# Patient Record
Sex: Male | Born: 1995 | Race: White | Hispanic: No | Marital: Single | Smoking: Former smoker
Health system: Southern US, Community
[De-identification: ages and names within clinical notes are randomized; demographics above are authoritative.]

## PROBLEM LIST (undated history)

## (undated) DIAGNOSIS — F419 Anxiety disorder, unspecified: Secondary | ICD-10-CM

## (undated) HISTORY — PX: OTHER SURGICAL HISTORY: SHX169

## (undated) HISTORY — DX: Anxiety disorder, unspecified: F41.9

---
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
## 2017-12-28 ENCOUNTER — Encounter: Payer: Self-pay | Admitting: Adult Health

## 2017-12-28 ENCOUNTER — Ambulatory Visit: Payer: Managed Care, Other (non HMO) | Admitting: Adult Health

## 2017-12-28 VITALS — BP 110/70 | HR 86 | Resp 16 | Ht 68.0 in | Wt 150.6 lb

## 2017-12-28 DIAGNOSIS — R55 Syncope and collapse: Secondary | ICD-10-CM

## 2017-12-28 DIAGNOSIS — R300 Dysuria: Secondary | ICD-10-CM

## 2017-12-28 DIAGNOSIS — Z0001 Encounter for general adult medical examination with abnormal findings: Secondary | ICD-10-CM | POA: Diagnosis not present

## 2017-12-28 NOTE — Progress Notes (Signed)
[facility] [address] [place], [place] [address]  Internal MEDICINE  Office Visit Note  Patient Name: Dylan Evans  [birth year]  [id number]  Date of Service: 12/28/2017  Chief Complaint  Patient presents with  . Annual Exam    2 weeks ago past out, bp was little elevated in the er.. diagnoses with dehydration no further labs other ekg - normal     HPI Pt is here for routine health maintenance examination.  Mother is present at exam. She reports that on July 8th the pt had a syncopal episode.  He immediately woke up after convulsing for approx 20 seconds.  [facility] and they determined he was dehydrated. He has had no other episodes and feels like his baseline.     Current Medication: Outpatient Encounter Medications as of 12/28/2017  Medication Sig  . loratadine (CLARITIN) 10 MG tablet Take 10 mg by mouth daily.   No facility-administered encounter medications on file as of 12/28/2017.     Surgical History: Past Surgical History:  Procedure Laterality Date  . tubes in ears      Medical History: History reviewed. No pertinent past medical history.  Family History: Family History  Problem Relation Age of Onset  . Stomach cancer Maternal Grandmother   . Interstitial cystitis Maternal Grandfather   . Stomach cancer Paternal Grandmother       Review of Systems  Constitutional: Negative.  Negative for chills, fatigue and unexpected weight change.  HENT: Negative.  Negative for congestion, rhinorrhea, sneezing and sore throat.   Eyes: Negative for redness.  Respiratory: Negative.  Negative for cough, chest tightness and shortness of breath.   Cardiovascular: Negative.  Negative for chest pain and palpitations.  Gastrointestinal: Negative.  Negative for abdominal pain, constipation, diarrhea, nausea and vomiting.  Endocrine: Negative.   Genitourinary: Negative.  Negative for dysuria and frequency.  Musculoskeletal: Negative.  Negative  for arthralgias, back pain, joint swelling and neck pain.  Skin: Negative.  Negative for rash.  Allergic/Immunologic: Negative.   Neurological: Negative.  Negative for tremors and numbness.  Hematological: Negative for adenopathy. Does not bruise/bleed easily.  Psychiatric/Behavioral: Negative.  Negative for behavioral problems, sleep disturbance and suicidal ideas. The patient is not nervous/anxious.      Vital Signs: BP 110/70   Pulse 86   Resp 16   Ht 5\' 8"  (1.727 m)   Wt 150 lb 9.6 oz (68.3 kg)   SpO2 98%   BMI 22.90 kg/m    Physical Exam  Constitutional: He is oriented to person, place, and time. He appears well-developed and well-nourished. No distress.  HENT:  Head: Normocephalic and atraumatic.  Mouth/Throat: Oropharynx is clear and moist. No oropharyngeal exudate.  Eyes: Pupils are equal, round, and reactive to light. EOM are normal.  Neck: Normal range of motion. Neck supple. No JVD present. No tracheal deviation present. No thyromegaly present.  Cardiovascular: Normal rate, regular rhythm and normal heart sounds. Exam reveals no gallop and no friction rub.  No murmur heard. Pulmonary/Chest: Effort normal and breath sounds normal. No respiratory distress. He has no wheezes. He has no rales. He exhibits no tenderness.  Abdominal: Soft. There is no tenderness. There is no guarding. Hernia confirmed negative in the right inguinal area and confirmed negative in the left inguinal area.  Genitourinary: Testes normal and penis normal. Cremasteric reflex is present. Right testis shows no mass, no swelling and no tenderness. Right testis is descended. Left testis shows no  mass, no swelling and no tenderness. Left testis is descended. Circumcised.  Musculoskeletal: Normal range of motion.  Lymphadenopathy:    He has no cervical adenopathy. No inguinal adenopathy noted on the right or left side.  Neurological: He is alert and oriented to person, place, and time. No cranial nerve  deficit.  Skin: Skin is warm and dry. He is not diaphoretic.  Psychiatric: He has a normal mood and affect. His behavior is normal. Judgment and thought content normal.  Nursing note and vitals reviewed.    LABS: No results found for this or any previous visit (from the past 2160 hour(s)).  Assessment/Plan: 1. Encounter for general adult medical examination with abnormal findings - CBC with Differential/Platelet - Lipid Panel With LDL/HDL Ratio - TSH - T4, free - Comprehensive metabolic panel  2. Syncope and collapse Seen in ED. Possible Dehydration. Pt and family to continue to monitor for changes.  3. Dysuria - UA/M w/rflx Culture, Routine  General Counseling: Benjaman verbalizes understanding of the findings of todays visit and agrees with plan of treatment. I have discussed any further diagnostic evaluation that may be needed or ordered today. We also reviewed his medications today. he has been encouraged to call the office with any questions or concerns that should arise related to todays visit.   Orders Placed This Encounter  Procedures  . UA/M w/rflx Culture, Routine    No orders of the defined types were placed in this encounter.   Time spent: 25 Minutes   This patient was seen by Blima Ledger AGNP-C in Collaboration with Dr Lyndon Code as a part of collaborative care agreement   Lyndon Code, MD  Internal Medicine

## 2017-12-28 NOTE — Patient Instructions (Signed)
Syncope Syncope is when you lose temporarily pass out (faint). Signs that you may be about to pass out include:  Feeling dizzy or light-headed.  Feeling sick to your stomach (nauseous).  Seeing all white or all black.  Having cold, clammy skin.  If you passed out, get help right away. Call your local emergency services (911 in the [place] Do not drive yourself to the hospital. Follow these instructions at home: Pay attention to any changes in your symptoms. Take these actions to help with your condition:  Have someone stay with you until you feel stable.  Do not drive, use machinery, or play sports until your doctor says it is okay.  Keep all follow-up visits as told by your doctor. This is important.  If you start to feel like you might pass out, lie down right away and raise (elevate) your feet above the level of your heart. Breathe deeply and steadily. Wait until all of the symptoms are gone.  Drink enough fluid to keep your pee (urine) clear or pale yellow.  If you are taking blood pressure or heart medicine, get up slowly and spend many minutes getting ready to sit and then stand. This can help with dizziness.  Take over-the-counter and prescription medicines only as told by your doctor.  Get help right away if:  You have a very bad headache.  You have unusual pain in your chest, tummy, or back.  You are bleeding from your mouth or rectum.  You have black or tarry poop (stool).  You have a very fast or uneven heartbeat (palpitations).  It hurts to breathe.  You pass out once or more than once.  You have jerky movements that you cannot control (seizure).  You are confused.  You have trouble walking.  You are very weak.  You have vision problems. These symptoms may be an emergency. Do not wait to see if the symptoms will go away. Get medical help right away. Call your local emergency services (911 in the [place] Do not drive yourself to the hospital. This  information is not intended to replace advice given to you by your health care provider. Make sure you discuss any questions you have with your health care provider. Document Released: 11/09/2007 Document Revised: 10/29/2015 Document Reviewed: 02/04/2015 Elsevier Interactive Patient Education  2018 Elsevier Inc.  

## 2017-12-29 LAB — MICROSCOPIC EXAMINATION
Bacteria, UA: NONE SEEN
Casts: NONE SEEN /lpf
Epithelial Cells (non renal): NONE SEEN /hpf (ref 0–10)
WBC, UA: NONE SEEN /hpf (ref 0–5)

## 2017-12-29 LAB — UA/M W/RFLX CULTURE, ROUTINE
Bilirubin, UA: NEGATIVE
Glucose, UA: NEGATIVE
Ketones, UA: NEGATIVE
Leukocytes, UA: NEGATIVE
Nitrite, UA: NEGATIVE
Protein, UA: NEGATIVE
RBC, UA: NEGATIVE
Specific Gravity, UA: 1.01 (ref 1.005–1.030)
Urobilinogen, Ur: 0.2 mg/dL (ref 0.2–1.0)
pH, UA: 5.5 (ref 5.0–7.5)

## 2018-01-08 ENCOUNTER — Encounter: Payer: Self-pay | Admitting: Adult Health

## 2018-01-16 LAB — COMPREHENSIVE METABOLIC PANEL
ALBUMIN: 4.7 g/dL (ref 3.5–5.5)
ALK PHOS: 60 IU/L (ref 39–117)
ALT: 10 IU/L (ref 0–44)
AST: 20 IU/L (ref 0–40)
Albumin/Globulin Ratio: 2.1 (ref 1.2–2.2)
BILIRUBIN TOTAL: 0.5 mg/dL (ref 0.0–1.2)
BUN/Creatinine Ratio: 9 (ref 9–20)
BUN: 11 mg/dL (ref 6–20)
CO2: 24 mmol/L (ref 20–29)
CREATININE: 1.26 mg/dL (ref 0.76–1.27)
Calcium: 9.9 mg/dL (ref 8.7–10.2)
Chloride: 104 mmol/L (ref 96–106)
GFR calc Af Amer: 95 mL/min/{1.73_m2} (ref >59)
GFR calc non Af Amer: 82 mL/min/{1.73_m2} (ref >59)
GLUCOSE: 100 mg/dL — AB (ref 65–99)
Globulin, Total: 2.2 g/dL (ref 1.5–4.5)
POTASSIUM: 4.8 mmol/L (ref 3.5–5.2)
SODIUM: 140 mmol/L (ref 134–144)
Total Protein: 6.9 g/dL (ref 6.0–8.5)

## 2018-01-16 LAB — CBC WITH DIFFERENTIAL/PLATELET
Basophils Absolute: 0 10*3/uL (ref 0.0–0.2)
Basos: 0 %
EOS (ABSOLUTE): 0.1 10*3/uL (ref 0.0–0.4)
EOS: 2 %
HEMATOCRIT: 46.2 % (ref 37.5–51.0)
HEMOGLOBIN: 15.7 g/dL (ref 13.0–17.7)
Immature Grans (Abs): 0 10*3/uL (ref 0.0–0.1)
Immature Granulocytes: 0 %
LYMPHS ABS: 1.3 10*3/uL (ref 0.7–3.1)
Lymphs: 22 %
MCH: 29.8 pg (ref 26.6–33.0)
MCHC: 34 g/dL (ref 31.5–35.7)
MCV: 88 fL (ref 79–97)
MONOS ABS: 0.3 10*3/uL (ref 0.1–0.9)
Monocytes: 5 %
NEUTROS ABS: 4.4 10*3/uL (ref 1.4–7.0)
Neutrophils: 71 %
Platelets: 174 10*3/uL (ref 150–450)
RBC: 5.26 x10E6/uL (ref 4.14–5.80)
RDW: 13 % (ref 12.3–15.4)
WBC: 6.1 10*3/uL (ref 3.4–10.8)

## 2018-01-16 LAB — LIPID PANEL WITH LDL/HDL RATIO
Cholesterol, Total: 150 mg/dL (ref 100–169)
HDL: 46 mg/dL (ref >39)
LDL CALC: 84 mg/dL (ref 0–109)
LDL/HDL RATIO: 1.8 ratio (ref 0.0–3.6)
TRIGLYCERIDES: 101 mg/dL — AB (ref 0–89)
VLDL Cholesterol Cal: 20 mg/dL (ref 5–40)

## 2018-01-16 LAB — TSH: TSH: 1.34 u[IU]/mL (ref 0.450–4.500)

## 2018-01-16 LAB — T4, FREE: Free T4: 1.34 ng/dL (ref 0.93–1.60)

## 2018-03-30 ENCOUNTER — Ambulatory Visit: Payer: Self-pay | Admitting: Adult Health

## 2018-08-20 ENCOUNTER — Ambulatory Visit: Payer: Managed Care, Other (non HMO) | Admitting: Adult Health

## 2018-08-20 ENCOUNTER — Other Ambulatory Visit: Payer: Self-pay

## 2018-08-20 ENCOUNTER — Encounter: Payer: Self-pay | Admitting: Adult Health

## 2018-08-20 VITALS — BP 149/88 | HR 110 | Temp 99.0°F | Resp 16 | Ht 68.0 in | Wt 134.0 lb

## 2018-08-20 DIAGNOSIS — J029 Acute pharyngitis, unspecified: Secondary | ICD-10-CM

## 2018-08-20 DIAGNOSIS — J988 Other specified respiratory disorders: Secondary | ICD-10-CM | POA: Diagnosis not present

## 2018-08-20 DIAGNOSIS — R062 Wheezing: Secondary | ICD-10-CM | POA: Diagnosis not present

## 2018-08-20 DIAGNOSIS — R634 Abnormal weight loss: Secondary | ICD-10-CM | POA: Diagnosis not present

## 2018-08-20 LAB — POCT INFLUENZA A/B
INFLUENZA A, POC: NEGATIVE
INFLUENZA B, POC: NEGATIVE

## 2018-08-20 MED ORDER — AZITHROMYCIN 250 MG PO TABS: ORAL_TABLET | ORAL | 0 refills | Status: DC

## 2018-08-20 MED ORDER — PREDNISONE 10 MG PO TABS: ORAL_TABLET | ORAL | 0 refills | Status: DC

## 2018-08-20 MED ORDER — ALBUTEROL SULFATE HFA 108 (90 BASE) MCG/ACT IN AERS: 2.0000 | INHALATION_SPRAY | Freq: Four times a day (QID) | RESPIRATORY_TRACT | 0 refills | Status: DC | PRN

## 2018-08-20 NOTE — Progress Notes (Signed)
[facility] [address] [place], [place] [address]  Internal MEDICINE  Office Visit Note  Patient Name: Dylan Evans  [id number]  [id number]  Date of Service: 08/20/2018  Chief Complaint  Patient presents with  . Cough    staying cold , was in [place] last week for spring break, symptoms started 2-3 days ago , cough and congestion started   . Fever  . Sore Throat     HPI Pt is here for a sick visit. Pt reports 2-3 days ago he noticed some congestion.  He quickly prgressed to coughing. He denies productivity with his cough.  He has felt chills, but his tmax has been 99.6.  He reports his throat is still sore. He just returned from a spring break trip to [place].  He states he consumed a large amount of alcohol while he was gone.  His mother is in the room initially. He has also lost 16 pounds since his visit last July.  He dates that school has intensified and he found himself sitting and not as active.  His diet has changed as he is not eating breakfast.  He report feels nauseous if he eats first thing in the morning.  He is typically eating lunch and dinner however is not eating the normal amount that he was.        Current Medication:  Outpatient Encounter Medications as of 08/20/2018  Medication Sig  . albuterol (PROVENTIL HFA;VENTOLIN HFA) 108 (90 Base) MCG/ACT inhaler Inhale 2 puffs into the lungs every 6 (six) hours as needed for wheezing or shortness of breath.  Marland Kitchen azithromycin (ZITHROMAX) 250 MG tablet Take as directed  . loratadine (CLARITIN) 10 MG tablet Take 10 mg by mouth daily.  . predniSONE (DELTASONE) 10 MG tablet Use per dose pack   No facility-administered encounter medications on file as of 08/20/2018.       Medical History: History reviewed. No pertinent past medical history.   Vital Signs: BP (!) 149/88   Pulse (!) 110   Temp 99 F (37.2 C) (Oral)   Resp 16   Ht 5\' 8"  (1.727 m)   Wt 134 lb (60.8 kg)   SpO2 94%   BMI 20.37 kg/m     Review of Systems  Constitutional: Positive for fever. Negative for chills, fatigue and unexpected weight change.  HENT: Positive for sore throat. Negative for congestion, rhinorrhea and sneezing.   Eyes: Negative for redness.  Respiratory: Positive for cough and wheezing. Negative for chest tightness and shortness of breath.   Cardiovascular: Negative.  Negative for chest pain and palpitations.  Gastrointestinal: Negative.  Negative for abdominal pain, constipation, diarrhea, nausea and vomiting.  Endocrine: Negative.   Genitourinary: Negative.  Negative for dysuria and frequency.  Musculoskeletal: Negative.  Negative for arthralgias, back pain, joint swelling and neck pain.  Skin: Negative.  Negative for rash.  Allergic/Immunologic: Negative.   Neurological: Negative.  Negative for tremors and numbness.  Hematological: Negative for adenopathy. Does not bruise/bleed easily.  Psychiatric/Behavioral: Negative.  Negative for behavioral problems, sleep disturbance and suicidal ideas. The patient is not nervous/anxious.     Physical Exam Vitals signs and nursing note reviewed.  Constitutional:      General: He is not in acute distress.    Appearance: He is well-developed. He is not diaphoretic.  HENT:     Head: Normocephalic and atraumatic.     Mouth/Throat:     Pharynx: No oropharyngeal exudate.  Eyes:     Pupils: Pupils  are equal, round, and reactive to light.  Neck:     Musculoskeletal: Normal range of motion and neck supple.     Thyroid: No thyromegaly.     Vascular: No JVD.     Trachea: No tracheal deviation.  Cardiovascular:     Rate and Rhythm: Normal rate and regular rhythm.     Heart sounds: Normal heart sounds. No murmur. No friction rub. No gallop.   Pulmonary:     Effort: Pulmonary effort is normal. No respiratory distress.     Breath sounds: Wheezing present. No rales.  Chest:     Chest wall: No tenderness.  Abdominal:     Palpations: Abdomen is soft.      Tenderness: There is no abdominal tenderness. There is no guarding.  Musculoskeletal: Normal range of motion.  Lymphadenopathy:     Cervical: No cervical adenopathy.  Skin:    General: Skin is warm and dry.  Neurological:     Mental Status: He is alert and oriented to person, place, and time.     Cranial Nerves: No cranial nerve deficit.  Psychiatric:        Behavior: Behavior normal.        Thought Content: Thought content normal.        Judgment: Judgment normal.     Assessment/Plan: 1. Respiratory infection Patient provided with course of azithromycin.  Instructed him on how to use medications to completion.  He verbalized understanding.  Reports he will follow-up here in clinic if symptoms fail to improve. - azithromycin (ZITHROMAX) 250 MG tablet; Take as directed  Dispense: 6 tablet; Refill: 0  2. Wheezing Right patient with presumed taper as well as albuterol inhaler we discussed using albuterol inhaler every 6 hours for the first 2 days and then he can use as needed after that.  Instructed to return to clinic if symptoms worsen or fail to improve with medication. - predniSONE (DELTASONE) 10 MG tablet; Use per dose pack  Dispense: 21 tablet; Refill: 0 - albuterol (PROVENTIL HFA;VENTOLIN HFA) 108 (90 Base) MCG/ACT inhaler; Inhale 2 puffs into the lungs every 6 (six) hours as needed for wheezing or shortness of breath.  Dispense: 1 Inhaler; Refill: 0  3. Sore throat Flu negative.  - POCT Influenza A/B  4. Abnormal weight loss Pt has lost 16 pounds since July of last year.  He denies any extreme changes.   Lab orders given to patient, will review as available.   General Counseling: Dylan Evans verbalizes understanding of the findings of todays visit and agrees with plan of treatment. I have discussed any further diagnostic evaluation that may be needed or ordered today. We also reviewed his medications today. he has been encouraged to call the office with any questions or concerns  that should arise related to todays visit.   Orders Placed This Encounter  Procedures  . POCT Influenza A/B    Meds ordered this encounter  Medications  . azithromycin (ZITHROMAX) 250 MG tablet    Sig: Take as directed    Dispense:  6 tablet    Refill:  0  . predniSONE (DELTASONE) 10 MG tablet    Sig: Use per dose pack    Dispense:  21 tablet    Refill:  0  . albuterol (PROVENTIL HFA;VENTOLIN HFA) 108 (90 Base) MCG/ACT inhaler    Sig: Inhale 2 puffs into the lungs every 6 (six) hours as needed for wheezing or shortness of breath.    Dispense:  1 Inhaler  Refill:  0    Time spent: 25 Minutes  This patient was seen by Orson Gear AGNP-C in Collaboration with Dr Lavera Guise as a part of collaborative care agreement.  Kendell Bane AGNP-C Internal Medicine

## 2018-08-21 ENCOUNTER — Other Ambulatory Visit: Payer: Self-pay | Admitting: Adult Health

## 2018-08-22 LAB — CBC WITH DIFFERENTIAL/PLATELET
Basophils Absolute: 0 10*3/uL (ref 0.0–0.2)
Basos: 0 %
EOS (ABSOLUTE): 0.3 10*3/uL (ref 0.0–0.4)
Eos: 3 %
Hematocrit: 39.4 % (ref 37.5–51.0)
Hemoglobin: 13.9 g/dL (ref 13.0–17.7)
Immature Grans (Abs): 0 10*3/uL (ref 0.0–0.1)
Immature Granulocytes: 0 %
Lymphocytes Absolute: 1.5 10*3/uL (ref 0.7–3.1)
Lymphs: 15 %
MCH: 30.5 pg (ref 26.6–33.0)
MCHC: 35.3 g/dL (ref 31.5–35.7)
MCV: 87 fL (ref 79–97)
Monocytes Absolute: 0.8 10*3/uL (ref 0.1–0.9)
Monocytes: 8 %
Neutrophils Absolute: 7.6 10*3/uL — ABNORMAL HIGH (ref 1.4–7.0)
Neutrophils: 74 %
Platelets: 229 10*3/uL (ref 150–450)
RBC: 4.55 x10E6/uL (ref 4.14–5.80)
RDW: 12 % (ref 11.6–15.4)
WBC: 10.3 10*3/uL (ref 3.4–10.8)

## 2018-08-22 LAB — COMPREHENSIVE METABOLIC PANEL
ALT: 9 IU/L (ref 0–44)
AST: 18 IU/L (ref 0–40)
Albumin/Globulin Ratio: 1.6 (ref 1.2–2.2)
Albumin: 4.3 g/dL (ref 4.1–5.2)
Alkaline Phosphatase: 71 IU/L (ref 39–117)
BUN/Creatinine Ratio: 6 — ABNORMAL LOW (ref 9–20)
BUN: 7 mg/dL (ref 6–20)
Bilirubin Total: 0.5 mg/dL (ref 0.0–1.2)
CALCIUM: 9.8 mg/dL (ref 8.7–10.2)
CO2: 25 mmol/L (ref 20–29)
Chloride: 98 mmol/L (ref 96–106)
Creatinine, Ser: 1.2 mg/dL (ref 0.76–1.27)
GFR calc Af Amer: 100 mL/min/{1.73_m2} (ref >59)
GFR calc non Af Amer: 87 mL/min/{1.73_m2} (ref >59)
Globulin, Total: 2.7 g/dL (ref 1.5–4.5)
Glucose: 92 mg/dL (ref 65–99)
Potassium: 4.4 mmol/L (ref 3.5–5.2)
Sodium: 139 mmol/L (ref 134–144)
Total Protein: 7 g/dL (ref 6.0–8.5)

## 2018-08-22 LAB — LIPID PANEL WITH LDL/HDL RATIO
Cholesterol, Total: 126 mg/dL (ref 100–199)
HDL: 43 mg/dL (ref >39)
LDL CALC: 64 mg/dL (ref 0–99)
LDl/HDL Ratio: 1.5 ratio (ref 0.0–3.6)
Triglycerides: 94 mg/dL (ref 0–149)
VLDL Cholesterol Cal: 19 mg/dL (ref 5–40)

## 2018-08-22 LAB — IRON AND TIBC
Iron Saturation: 21 % (ref 15–55)
Iron: 45 ug/dL (ref 38–169)
Total Iron Binding Capacity: 218 ug/dL — ABNORMAL LOW (ref 250–450)
UIBC: 173 ug/dL (ref 111–343)

## 2018-08-22 LAB — T3 UPTAKE: T3 Uptake Ratio: 30 % (ref 24–39)

## 2018-08-22 LAB — B12 AND FOLATE PANEL
Folate: 8.1 ng/mL (ref >3.0)
Vitamin B-12: 528 pg/mL (ref 232–1245)

## 2018-08-22 LAB — T4, FREE: FREE T4: 1.36 ng/dL (ref 0.82–1.77)

## 2018-08-22 LAB — VITAMIN D 25 HYDROXY (VIT D DEFICIENCY, FRACTURES): VIT D 25 HYDROXY: 38.6 ng/mL (ref 30.0–100.0)

## 2018-08-22 LAB — FERRITIN: Ferritin: 343 ng/mL (ref 30–400)

## 2018-11-12 ENCOUNTER — Ambulatory Visit: Payer: Managed Care, Other (non HMO) | Admitting: Adult Health

## 2018-11-12 ENCOUNTER — Encounter: Payer: Self-pay | Admitting: Adult Health

## 2018-11-12 ENCOUNTER — Other Ambulatory Visit: Payer: Self-pay

## 2018-11-12 VITALS — Resp 16 | Ht 68.0 in | Wt 145.0 lb

## 2018-11-12 DIAGNOSIS — R634 Abnormal weight loss: Secondary | ICD-10-CM

## 2018-11-12 DIAGNOSIS — F419 Anxiety disorder, unspecified: Secondary | ICD-10-CM

## 2018-11-12 MED ORDER — CITALOPRAM HYDROBROMIDE 20 MG PO TABS: 20.0000 mg | ORAL_TABLET | Freq: Every day | ORAL | 1 refills | Status: DC

## 2018-11-12 NOTE — Progress Notes (Signed)
[facility] [address] [place], [place] [address]  Internal MEDICINE  Telephone Visit  Patient Name: Dylan Evans  1997  [id number]  Date of Service: 11/12/2018  I connected with the patient at [phone] by telephone and verified the patients identity using two identifiers.   I discussed the limitations, risks, security and privacy concerns of performing an evaluation and management service by telephone and the availability of in person appointments. I also discussed with the patient that there may be a patient responsible charge related to the service.  The patient expressed understanding and agrees to proceed.    Chief Complaint  Patient presents with  . Telephone Screen  . Anxiety    having some anxiety at nightime that is disrupting sleep   . Insomnia  . Telephone Assessment    HPI  Pt is seen via telephone complaining today of anxiety.  He especially complains of nighttime anxiety and racing thoughts when he lays down to sleep at night.  He has had some episodes during the day, however its mostly at night. He denies being able to pinpoint anything that is causing him stress.  He feels happy and content until an episode happens.    Current Medication: Outpatient Encounter Medications as of 11/12/2018  Medication Sig  . loratadine (CLARITIN) 10 MG tablet Take 10 mg by mouth daily.  . [DISCONTINUED] albuterol (PROVENTIL HFA;VENTOLIN HFA) 108 (90 Base) MCG/ACT inhaler Inhale 2 puffs into the lungs every 6 (six) hours as needed for wheezing or shortness of breath. (Patient not taking: Reported on 11/12/2018)  . [DISCONTINUED] azithromycin (ZITHROMAX) 250 MG tablet Take as directed (Patient not taking: Reported on 11/12/2018)  . [DISCONTINUED] predniSONE (DELTASONE) 10 MG tablet Use per dose pack (Patient not taking: Reported on 11/12/2018)   No facility-administered encounter medications on file as of 11/12/2018.     Surgical History: Past Surgical History:  Procedure  Laterality Date  . tubes in ears      Medical History: History reviewed. No pertinent past medical history.  Family History: Family History  Problem Relation Age of Onset  . Stomach cancer Maternal Grandmother   . Interstitial cystitis Maternal Grandfather   . Stomach cancer Paternal Grandmother     Social History   Socioeconomic History  . Marital status: Single    Spouse name: Not on file  . Number of children: Not on file  . Years of education: Not on file  . Highest education level: Not on file  Occupational History  . Not on file  Social Needs  . Financial resource strain: Not on file  . Food insecurity:    Worry: Not on file    Inability: Not on file  . Transportation needs:    Medical: Not on file    Non-medical: Not on file  Tobacco Use  . Smoking status: Never Smoker  . Smokeless tobacco: Never Used  Substance and Sexual Activity  . Alcohol use: Never    Frequency: Never  . Drug use: Never  . Sexual activity: Not on file  Lifestyle  . Physical activity:    Days per week: Not on file    Minutes per session: Not on file  . Stress: Not on file  Relationships  . Social connections:    Talks on phone: Not on file    Gets together: Not on file    Attends religious service: Not on file    Active member of club or organization: Not on file  Attends meetings of clubs or organizations: Not on file    Relationship status: Not on file  . Intimate partner violence:    Fear of current or ex partner: Not on file    Emotionally abused: Not on file    Physically abused: Not on file    Forced sexual activity: Not on file  Other Topics Concern  . Not on file  Social History Narrative  . Not on file      Review of Systems  Constitutional: Negative.  Negative for chills, fatigue and unexpected weight change.  HENT: Negative.  Negative for congestion, rhinorrhea, sneezing and sore throat.   Eyes: Negative for redness.  Respiratory: Negative.  Negative for  cough, chest tightness and shortness of breath.   Cardiovascular: Negative.  Negative for chest pain and palpitations.  Gastrointestinal: Negative.  Negative for abdominal pain, constipation, diarrhea, nausea and vomiting.  Endocrine: Negative.   Genitourinary: Negative.  Negative for dysuria and frequency.  Musculoskeletal: Negative.  Negative for arthralgias, back pain, joint swelling and neck pain.  Skin: Negative.  Negative for rash.  Allergic/Immunologic: Negative.   Neurological: Negative.  Negative for tremors and numbness.  Hematological: Negative for adenopathy. Does not bruise/bleed easily.  Psychiatric/Behavioral: Negative.  Negative for behavioral problems, sleep disturbance and suicidal ideas. The patient is not nervous/anxious.     Vital Signs: Resp 16   Ht 5\' 8"  (1.727 m)   Wt 145 lb (65.8 kg)   BMI 22.05 kg/m    Observation/Objective:  Well appearing, nad noted at this time.   Assessment/Plan: 1. Anxiety Start taking Celexa as prescribed.  Discussed common side effects.  Will follow up in 7 weeks.  Pt is instructed to call office if new or worsening symptoms or reactions occur.  - citalopram (CELEXA) 20 MG tablet; Take 1 tablet (20 mg total) by mouth daily.  Dispense: 30 tablet; Refill: 1  2. Abnormal weight loss PT has managed to gain back 10 pounds since being home from college during pandemic. Will continue to monitor.  General Counseling: Dylan Evans verbalizes understanding of the findings of today's phone visit and agrees with plan of treatment. I have discussed any further diagnostic evaluation that may be needed or ordered today. We also reviewed his medications today. he has been encouraged to call the office with any questions or concerns that should arise related to todays visit.    No orders of the defined types were placed in this encounter.   No orders of the defined types were placed in this encounter.   Time spent: [place]  [facility] Internal medicine

## 2018-11-12 NOTE — Patient Instructions (Addendum)

## 2018-12-31 ENCOUNTER — Encounter: Payer: Self-pay | Admitting: Adult Health

## 2019-05-15 ENCOUNTER — Other Ambulatory Visit: Payer: Self-pay

## 2019-05-15 ENCOUNTER — Ambulatory Visit (INDEPENDENT_AMBULATORY_CARE_PROVIDER_SITE_OTHER): Payer: Managed Care, Other (non HMO) | Admitting: Adult Health

## 2019-05-15 ENCOUNTER — Encounter: Payer: Self-pay | Admitting: Adult Health

## 2019-05-15 VITALS — BP 113/69 | HR 89 | Temp 98.1°F | Resp 16 | Ht 68.0 in | Wt 150.4 lb

## 2019-05-15 DIAGNOSIS — G4700 Insomnia, unspecified: Secondary | ICD-10-CM

## 2019-05-15 DIAGNOSIS — F419 Anxiety disorder, unspecified: Secondary | ICD-10-CM

## 2019-05-15 DIAGNOSIS — R55 Syncope and collapse: Secondary | ICD-10-CM

## 2019-05-15 DIAGNOSIS — R5383 Other fatigue: Secondary | ICD-10-CM

## 2019-05-15 DIAGNOSIS — R300 Dysuria: Secondary | ICD-10-CM

## 2019-05-15 DIAGNOSIS — Z0001 Encounter for general adult medical examination with abnormal findings: Secondary | ICD-10-CM | POA: Diagnosis not present

## 2019-05-15 DIAGNOSIS — R634 Abnormal weight loss: Secondary | ICD-10-CM

## 2019-05-15 MED ORDER — HYDROXYZINE HCL 25 MG PO TABS: 25.0000 mg | ORAL_TABLET | Freq: Every evening | ORAL | 1 refills | Status: DC | PRN

## 2019-05-15 NOTE — Progress Notes (Signed)
[facility] [address] [place], [place] [address]  Internal MEDICINE  Office Visit Note  Patient Name: Dylan Evans  [id number]  [id number]  Date of Service: 05/15/2019  Chief Complaint  Patient presents with  . Annual Exam  . Anxiety  . Insomnia     HPI Pt is here for routine health maintenance examination.  Pt is a well appearing 23 yo male. He continues to report issues with insomnia, that he relates to his anxiety. He was placed on Celexa at last visit, and reports he took this medication for 2 months before his mom didn't feel comfortable with him taking it anymore.  Since then he feels like his anxiety is better controlled and he reports online school due to covid, has become easier. He does continue to report insomnia, and describes this as his most urgent complaint. He reports trying multiple OTC medications, and gets mild relief from them at times.  He reports having difficulty falling asleep, as well as staying asleep.  When he takes OTC meds he may sleep for 2 hours but then he will be awake again.  He appears well today, he is smiling and laughing in the exam room.     Current Medication: Outpatient Encounter Medications as of 05/15/2019  Medication Sig  . citalopram (CELEXA) 20 MG tablet Take 1 tablet (20 mg total) by mouth daily.  Marland Kitchen loratadine (CLARITIN) 10 MG tablet Take 10 mg by mouth daily.   No facility-administered encounter medications on file as of 05/15/2019.     Surgical History: Past Surgical History:  Procedure Laterality Date  . tubes in ears      Medical History: History reviewed. No pertinent past medical history.  Family History: Family History  Problem Relation Age of Onset  . Stomach cancer Maternal Grandmother   . Interstitial cystitis Maternal Grandfather   . Stomach cancer Paternal Grandmother       Review of Systems  Constitutional: Negative.  Negative for chills, fatigue and unexpected weight change.  HENT:  Negative.  Negative for congestion, rhinorrhea, sneezing and sore throat.   Eyes: Negative for redness.  Respiratory: Negative.  Negative for cough, chest tightness and shortness of breath.   Cardiovascular: Negative.  Negative for chest pain and palpitations.  Gastrointestinal: Negative.  Negative for abdominal pain, constipation, diarrhea, nausea and vomiting.  Endocrine: Negative.   Genitourinary: Negative.  Negative for dysuria and frequency.  Musculoskeletal: Negative.  Negative for arthralgias, back pain, joint swelling and neck pain.  Skin: Negative.  Negative for rash.  Allergic/Immunologic: Negative.   Neurological: Negative.  Negative for tremors and numbness.  Hematological: Negative for adenopathy. Does not bruise/bleed easily.  Psychiatric/Behavioral: Negative.  Negative for behavioral problems, sleep disturbance and suicidal ideas. The patient is not nervous/anxious.      Vital Signs: BP 113/69   Pulse 89   Temp 98.1 F (36.7 C)   Resp 16   Ht 5\' 8"  (1.727 m)   Wt 150 lb 6.4 oz (68.2 kg)   SpO2 99%   BMI 22.87 kg/m    Physical Exam Vitals and nursing note reviewed.  Constitutional:      General: He is not in acute distress.    Appearance: He is well-developed. He is not diaphoretic.  HENT:     Head: Normocephalic and atraumatic.     Mouth/Throat:     Pharynx: No oropharyngeal exudate.  Eyes:     Pupils: Pupils are equal, round, and reactive to light.  Neck:  Thyroid: No thyromegaly.     Vascular: No JVD.     Trachea: No tracheal deviation.  Cardiovascular:     Rate and Rhythm: Normal rate and regular rhythm.     Heart sounds: Normal heart sounds. No murmur. No friction rub. No gallop.   Pulmonary:     Effort: Pulmonary effort is normal. No respiratory distress.     Breath sounds: Normal breath sounds. No wheezing or rales.  Chest:     Chest wall: No tenderness.  Abdominal:     Palpations: Abdomen is soft.     Tenderness: There is no abdominal  tenderness. There is no guarding.     Hernia: There is no hernia in the left inguinal area or right inguinal area.  Genitourinary:    Penis: Normal and circumcised.      Testes: Normal. Cremasteric reflex is present.        Right: Mass, tenderness or swelling not present.        Left: Mass, tenderness or swelling not present.     Epididymis:     Right: Normal.     Left: Normal.  Musculoskeletal:        General: Normal range of motion.     Cervical back: Normal range of motion and neck supple.  Lymphadenopathy:     Cervical: No cervical adenopathy.     Lower Body: No right inguinal adenopathy. No left inguinal adenopathy.  Skin:    General: Skin is warm and dry.  Neurological:     Mental Status: He is alert and oriented to person, place, and time.     Cranial Nerves: No cranial nerve deficit.  Psychiatric:        Behavior: Behavior normal.        Thought Content: Thought content normal.        Judgment: Judgment normal.    LABS: No results found for this or any previous visit (from the past 2160 hour(s)).   Assessment/Plan: 1. Encounter for general adult medical examination with abnormal findings Up to date on PHM, will get labs and review with patient at next visit.  - CBC with Differential/Platelet - Lipid Panel With LDL/HDL Ratio - TSH - T4, free - Comprehensive metabolic panel  2. Anxiety Pt reports coping with anxiety, he does feel it is contributing to his insomnia. We discussed Atarax could help with anxiety and insomnia.  3. Insomnia, unspecified type Will start Atarax and follow up with patient.  Discussed side effects of Atarax and to not drive until he knows what those side effects will be. Also do not drink alcohol.  - hydrOXYzine (ATARAX/VISTARIL) 25 MG tablet; Take 1 tablet (25 mg total) by mouth at bedtime as needed.  Dispense: 30 tablet; Refill: 1  4. Other fatigue Recheck following levels - B12 and Folate Panel - Vitamin D 1,25 dihydroxy -  Fe+TIBC+Fer  5. Dysuria - UA/M w/rflx Culture, Routine - Microscopic Examination  6. Abnormal weight loss PTs weight has stabilized and he feels good at this weight.  7. Syncope and collapse No other issues, he has not had another event.   General Counseling: Dylan Evans verbalizes understanding of the findings of todays visit and agrees with plan of treatment. I have discussed any further diagnostic evaluation that may be needed or ordered today. We also reviewed his medications today. he has been encouraged to call the office with any questions or concerns that should arise related to todays visit.   Orders Placed This Encounter  Procedures  .  UA/M w/rflx Culture, Routine    No orders of the defined types were placed in this encounter.   Time spent: 30 Minutes   This patient was seen by Orson Gear AGNP-C in Collaboration with Dr Lavera Guise as a part of collaborative care agreement    Kendell Bane AGNP-C Internal Medicine

## 2019-05-16 LAB — UA/M W/RFLX CULTURE, ROUTINE
Bilirubin, UA: NEGATIVE
Glucose, UA: NEGATIVE
Ketones, UA: NEGATIVE
Leukocytes,UA: NEGATIVE
Nitrite, UA: NEGATIVE
Protein,UA: NEGATIVE
RBC, UA: NEGATIVE
Specific Gravity, UA: 1.027 (ref 1.005–1.030)
Urobilinogen, Ur: 1 mg/dL (ref 0.2–1.0)
pH, UA: 7 (ref 5.0–7.5)

## 2019-05-16 LAB — MICROSCOPIC EXAMINATION
Bacteria, UA: NONE SEEN
Casts: NONE SEEN /lpf
Epithelial Cells (non renal): NONE SEEN /hpf (ref 0–10)

## 2019-06-05 ENCOUNTER — Encounter: Payer: Self-pay | Admitting: Adult Health

## 2019-06-05 ENCOUNTER — Ambulatory Visit: Payer: Managed Care, Other (non HMO) | Admitting: Adult Health

## 2019-06-05 ENCOUNTER — Other Ambulatory Visit: Payer: Self-pay

## 2019-06-05 VITALS — BP 117/71 | HR 89 | Ht 68.0 in | Wt 152.8 lb

## 2019-06-05 DIAGNOSIS — G4700 Insomnia, unspecified: Secondary | ICD-10-CM | POA: Diagnosis not present

## 2019-06-05 DIAGNOSIS — F419 Anxiety disorder, unspecified: Secondary | ICD-10-CM

## 2019-06-05 MED ORDER — HYDROXYZINE HCL 25 MG PO TABS: 25.0000 mg | ORAL_TABLET | Freq: Every evening | ORAL | 2 refills | Status: DC | PRN

## 2019-06-05 NOTE — Progress Notes (Signed)
[facility] [address] [place], [place] [address]  Internal MEDICINE  Office Visit Note  Patient Name: Dylan Evans  [id number]  [id number]  Date of Service: 06/05/2019  Chief Complaint  Patient presents with  . Follow-up    sleep medication review, pt states medication does not really help him sleep. pt did not do labs    HPI  Pt is here for follow up.  At last visit he started on 25mg  hydroxyzine for sleep and anxiety.  He reports he is not waking up multiple times a night like he was before.  He still has difficulty falling asleep.  He is focusing on his sleep hygiene and has been sticking to his routine.    Current Medication: Outpatient Encounter Medications as of 06/05/2019  Medication Sig  . citalopram (CELEXA) 20 MG tablet Take 1 tablet (20 mg total) by mouth daily.  . hydrOXYzine (ATARAX/VISTARIL) 25 MG tablet Take 1-2 tablets (25-50 mg total) by mouth at bedtime as needed.  . loratadine (CLARITIN) 10 MG tablet Take 10 mg by mouth daily.  . [DISCONTINUED] hydrOXYzine (ATARAX/VISTARIL) 25 MG tablet Take 1 tablet (25 mg total) by mouth at bedtime as needed.   No facility-administered encounter medications on file as of 06/05/2019.    Surgical History: Past Surgical History:  Procedure Laterality Date  . tubes in ears      Medical History: History reviewed. No pertinent past medical history.  Family History: Family History  Problem Relation Age of Onset  . Stomach cancer Maternal Grandmother   . Interstitial cystitis Maternal Grandfather   . Stomach cancer Paternal Grandmother     Social History   Socioeconomic History  . Marital status: Single    Spouse name: Not on file  . Number of children: Not on file  . Years of education: Not on file  . Highest education level: Not on file  Occupational History  . Not on file  Tobacco Use  . Smoking status: Never Smoker  . Smokeless tobacco: Never Used  Substance and Sexual Activity  .  Alcohol use: Yes    Comment: 2-3 days month  . Drug use: Never  . Sexual activity: Not on file  Other Topics Concern  . Not on file  Social History Narrative  . Not on file   Social Determinants of Health   Financial Resource Strain:   . Difficulty of Paying Living Expenses: Not on file  Food Insecurity:   . Worried About 06/07/2019 in the Last Year: Not on file  . Ran Out of Food in the Last Year: Not on file  Transportation Needs:   . Lack of Transportation (Medical): Not on file  . Lack of Transportation (Non-Medical): Not on file  Physical Activity:   . Days of Exercise per Week: Not on file  . Minutes of Exercise per Session: Not on file  Stress:   . Feeling of Stress : Not on file  Social Connections:   . Frequency of Communication with Friends and Family: Not on file  . Frequency of Social Gatherings with Friends and Family: Not on file  . Attends Religious Services: Not on file  . Active Member of Clubs or Organizations: Not on file  . Attends Programme researcher, broadcasting/film/video Meetings: Not on file  . Marital Status: Not on file  Intimate Partner Violence:   . Fear of Current or Ex-Partner: Not on file  . Emotionally Abused: Not on file  . Physically Abused: Not  on file  . Sexually Abused: Not on file      Review of Systems  Constitutional: Negative.  Negative for chills, fatigue and unexpected weight change.  HENT: Negative.  Negative for congestion, rhinorrhea, sneezing and sore throat.   Eyes: Negative for redness.  Respiratory: Negative.  Negative for cough, chest tightness and shortness of breath.   Cardiovascular: Negative.  Negative for chest pain and palpitations.  Gastrointestinal: Negative.  Negative for abdominal pain, constipation, diarrhea, nausea and vomiting.  Endocrine: Negative.   Genitourinary: Negative.  Negative for dysuria and frequency.  Musculoskeletal: Negative.  Negative for arthralgias, back pain, joint swelling and neck pain.  Skin:  Negative.  Negative for rash.  Allergic/Immunologic: Negative.   Neurological: Negative.  Negative for tremors and numbness.  Hematological: Negative for adenopathy. Does not bruise/bleed easily.  Psychiatric/Behavioral: Negative.  Negative for behavioral problems, sleep disturbance and suicidal ideas. The patient is not nervous/anxious.     Vital Signs: BP 117/71   Pulse 89   Ht 5\' 8"  (1.727 m)   Wt 152 lb 12.8 oz (69.3 kg)   SpO2 98%   BMI 23.23 kg/m    Physical Exam Vitals and nursing note reviewed.  Constitutional:      General: He is not in acute distress.    Appearance: He is well-developed. He is not diaphoretic.  HENT:     Head: Normocephalic and atraumatic.     Mouth/Throat:     Pharynx: No oropharyngeal exudate.  Eyes:     Pupils: Pupils are equal, round, and reactive to light.  Neck:     Thyroid: No thyromegaly.     Vascular: No JVD.     Trachea: No tracheal deviation.  Cardiovascular:     Rate and Rhythm: Normal rate and regular rhythm.     Heart sounds: Normal heart sounds. No murmur. No friction rub. No gallop.   Pulmonary:     Effort: Pulmonary effort is normal. No respiratory distress.     Breath sounds: Normal breath sounds. No wheezing or rales.  Chest:     Chest wall: No tenderness.  Abdominal:     Palpations: Abdomen is soft.     Tenderness: There is no abdominal tenderness. There is no guarding.  Musculoskeletal:        General: Normal range of motion.     Cervical back: Normal range of motion and neck supple.  Lymphadenopathy:     Cervical: No cervical adenopathy.  Skin:    General: Skin is warm and dry.  Neurological:     Mental Status: He is alert and oriented to person, place, and time.     Cranial Nerves: No cranial nerve deficit.  Psychiatric:        Behavior: Behavior normal.        Thought Content: Thought content normal.        Judgment: Judgment normal.     Assessment/Plan: 1. Insomnia, unspecified type Continue hydroxyzine,  increase to 2 tablets at night before bed.   2. Anxiety Encouraged him to start re-start Celexa.  Discussed benefits with anxiety, he agrees to try for 8 weeks.    General Counseling: Lovell verbalizes understanding of the findings of todays visit and agrees with plan of treatment. I have discussed any further diagnostic evaluation that may be needed or ordered today. We also reviewed his medications today. he has been encouraged to call the office with any questions or concerns that should arise related to todays visit.  No orders of the defined types were placed in this encounter.   Meds ordered this encounter  Medications  . hydrOXYzine (ATARAX/VISTARIL) 25 MG tablet    Sig: Take 1-2 tablets (25-50 mg total) by mouth at bedtime as needed.    Dispense:  60 tablet    Refill:  2    Time spent: 15 Minutes   This patient was seen by Blima LedgerAdam Tymarion Everard AGNP-C in Collaboration with Dr Lyndon CodeFozia M Khan as a part of collaborative care agreement     Johnna AcostaAdam J. Mohid Furuya AGNP-C Internal medicine

## 2019-08-09 ENCOUNTER — Telehealth: Payer: Self-pay

## 2019-08-09 NOTE — Telephone Encounter (Signed)
Tried contacting patient to inform of appointment on 08/13/2019, no vm. klh

## 2019-08-13 ENCOUNTER — Ambulatory Visit: Payer: Managed Care, Other (non HMO) | Admitting: Adult Health

## 2019-08-14 ENCOUNTER — Ambulatory Visit: Payer: Managed Care, Other (non HMO) | Admitting: Adult Health

## 2019-08-15 ENCOUNTER — Telehealth: Payer: Self-pay

## 2019-08-15 NOTE — Telephone Encounter (Signed)
CONFIRMED AND SCREENED FOR APPT 08/19/19

## 2019-08-19 ENCOUNTER — Ambulatory Visit: Payer: Managed Care, Other (non HMO) | Admitting: Adult Health

## 2019-08-19 ENCOUNTER — Encounter: Payer: Self-pay | Admitting: Adult Health

## 2019-08-19 ENCOUNTER — Other Ambulatory Visit: Payer: Self-pay

## 2019-08-19 VITALS — BP 125/86 | HR 69 | Temp 97.6°F | Resp 16 | Ht 69.0 in | Wt 149.0 lb

## 2019-08-19 DIAGNOSIS — R5383 Other fatigue: Secondary | ICD-10-CM | POA: Diagnosis not present

## 2019-08-19 DIAGNOSIS — G4700 Insomnia, unspecified: Secondary | ICD-10-CM

## 2019-08-19 DIAGNOSIS — F419 Anxiety disorder, unspecified: Secondary | ICD-10-CM

## 2019-08-19 DIAGNOSIS — R634 Abnormal weight loss: Secondary | ICD-10-CM | POA: Diagnosis not present

## 2019-08-19 MED ORDER — ESCITALOPRAM OXALATE 20 MG PO TABS: 20.0000 mg | ORAL_TABLET | Freq: Every day | ORAL | 2 refills | Status: DC

## 2019-08-19 MED ORDER — HYDROXYZINE HCL 25 MG PO TABS: 25.0000 mg | ORAL_TABLET | Freq: Every evening | ORAL | 2 refills | Status: DC | PRN

## 2019-08-19 MED ORDER — ESCITALOPRAM OXALATE 10 MG PO TABS: 10.0000 mg | ORAL_TABLET | Freq: Every day | ORAL | 0 refills | Status: DC

## 2019-08-19 NOTE — Progress Notes (Signed)
[facility] [address] [place], [place] [address]  Internal MEDICINE  Office Visit Note  Patient Name: Dylan Evans  [id number]  [id number]  Date of Service: 08/19/2019  Chief Complaint  Patient presents with  . Follow-up    HPI  Pt is here for routine follow up. Overall he is doing well.  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 He reports his anxiety at night has stayed the same.  He was tired on a celexa which he stopped after 2 months because he was mom did not feel comfortable with him taking it anymore. He reports his sleep has improved some, but often he continues to have trouble sleeping with his mind racing. He is interested in trying another medication at this time.     Current Medication: Outpatient Encounter Medications as of 08/19/2019  Medication Sig  . citalopram (CELEXA) 20 MG tablet Take 1 tablet (20 mg total) by mouth daily.  . hydrOXYzine (ATARAX/VISTARIL) 25 MG tablet Take 1-2 tablets (25-50 mg total) by mouth at bedtime as needed.  . loratadine (CLARITIN) 10 MG tablet Take 10 mg by mouth daily.   No facility-administered encounter medications on file as of 08/19/2019.    Surgical History: Past Surgical History:  Procedure Laterality Date  . tubes in ears      Medical History: History reviewed. No pertinent past medical history.  Family History: Family History  Problem Relation Age of Onset  . Stomach cancer Maternal Grandmother   . Interstitial cystitis Maternal Grandfather   . Stomach cancer Paternal Grandmother     Social History   Socioeconomic History  . Marital status: Single    Spouse name: Not on file  . Number of children: Not on file  . Years of education: Not on file  . Highest education level: Not on file  Occupational History  . Not on file  Tobacco Use  . Smoking status: Never Smoker  . Smokeless tobacco: Never Used  Substance and Sexual Activity  . Alcohol use: Yes    Comment:  2-3 days month  . Drug use: Never  . Sexual activity: Not on file  Other Topics Concern  . Not on file  Social History Narrative  . Not on file   Social Determinants of Health   Financial Resource Strain:   . Difficulty of Paying Living Expenses:   Food Insecurity:   . Worried About Programme researcher, broadcasting/film/video in the Last Year:   . Barista in the Last Year:   Transportation Needs:   . Freight forwarder (Medical):   Marland Kitchen Lack of Transportation (Non-Medical):   Physical Activity:   . Days of Exercise per Week:   . Minutes of Exercise per Session:   Stress:   . Feeling of Stress :   Social Connections:   . Frequency of Communication with Friends and Family:   . Frequency of Social Gatherings with Friends and Family:   . Attends Religious Services:   . Active Member of Clubs or Organizations:   . Attends Banker Meetings:   Marland Kitchen Marital Status:   Intimate Partner Violence:   . Fear of Current or Ex-Partner:   . Emotionally Abused:   Marland Kitchen Physically Abused:   . Sexually Abused:       Review of Systems  Constitutional: Negative.  Negative for chills, fatigue and unexpected weight change.  HENT: Negative.  Negative for congestion, rhinorrhea, sneezing and sore throat.  Eyes: Negative for redness.  Respiratory: Negative.  Negative for cough, chest tightness and shortness of breath.   Cardiovascular: Negative.  Negative for chest pain and palpitations.  Gastrointestinal: Negative.  Negative for abdominal pain, constipation, diarrhea, nausea and vomiting.  Endocrine: Negative.   Genitourinary: Negative.  Negative for dysuria and frequency.  Musculoskeletal: Negative.  Negative for arthralgias, back pain, joint swelling and neck pain.  Skin: Negative.  Negative for rash.  Allergic/Immunologic: Negative.   Neurological: Negative.  Negative for tremors and numbness.  Hematological: Negative for adenopathy. Does not bruise/bleed easily.  Psychiatric/Behavioral:  Negative.  Negative for behavioral problems, sleep disturbance and suicidal ideas. The patient is not nervous/anxious.     Vital Signs: BP 125/86   Pulse 69   Temp 97.6 F (36.4 C)   Resp 16   Ht 5\' 9"  (1.753 m)   Wt 149 lb (67.6 kg)   SpO2 97%   BMI 22.00 kg/m    Physical Exam Vitals and nursing note reviewed.  Constitutional:      General: He is not in acute distress.    Appearance: He is well-developed. He is not diaphoretic.  HENT:     Head: Normocephalic and atraumatic.     Mouth/Throat:     Pharynx: No oropharyngeal exudate.  Eyes:     Pupils: Pupils are equal, round, and reactive to light.  Neck:     Thyroid: No thyromegaly.     Vascular: No JVD.     Trachea: No tracheal deviation.  Cardiovascular:     Rate and Rhythm: Normal rate and regular rhythm.     Heart sounds: Normal heart sounds. No murmur. No friction rub. No gallop.   Pulmonary:     Effort: Pulmonary effort is normal. No respiratory distress.     Breath sounds: Normal breath sounds. No wheezing or rales.  Chest:     Chest wall: No tenderness.  Abdominal:     Palpations: Abdomen is soft.     Tenderness: There is no abdominal tenderness. There is no guarding.  Musculoskeletal:        General: Normal range of motion.     Cervical back: Normal range of motion and neck supple.  Lymphadenopathy:     Cervical: No cervical adenopathy.  Skin:    General: Skin is warm and dry.  Neurological:     Mental Status: He is alert and oriented to person, place, and time.     Cranial Nerves: No cranial nerve deficit.  Psychiatric:        Behavior: Behavior normal.        Thought Content: Thought content normal.        Judgment: Judgment normal.    Assessment/Plan: 1. Anxiety Start Lexapro 10 mg daily and then increase to 20mg  as discussed.  Call office for side effects or issues.  - escitalopram (LEXAPRO) 10 MG tablet; Take 1 tablet (10 mg total) by mouth daily.  Dispense: 30 tablet; Refill: 0 -  escitalopram (LEXAPRO) 20 MG tablet; Take 1 tablet (20 mg total) by mouth daily.  Dispense: 30 tablet; Refill: 2  2. Insomnia, unspecified type Take Vistaril at night as needed for sleep. - hydrOXYzine (ATARAX/VISTARIL) 25 MG tablet; Take 1-2 tablets (25-50 mg total) by mouth at bedtime as needed.  Dispense: 60 tablet; Refill: 2  3. Abnormal weight loss Resolved.   4. Other fatigue No complaints at this time.   General Counseling: Andras verbalizes understanding of the findings of todays visit and agrees with  plan of treatment. I have discussed any further diagnostic evaluation that may be needed or ordered today. We also reviewed his medications today. he has been encouraged to call the office with any questions or concerns that should arise related to todays visit.    No orders of the defined types were placed in this encounter.   No orders of the defined types were placed in this encounter.   Time spent: 30 Minutes   This patient was seen by Orson Gear AGNP-C in Collaboration with Dr Lavera Guise as a part of collaborative care agreement     Kendell Bane AGNP-C Internal medicine

## 2019-09-16 ENCOUNTER — Other Ambulatory Visit: Payer: Self-pay | Admitting: Adult Health

## 2019-09-16 DIAGNOSIS — F419 Anxiety disorder, unspecified: Secondary | ICD-10-CM

## 2019-09-17 ENCOUNTER — Other Ambulatory Visit: Payer: Self-pay

## 2019-09-17 ENCOUNTER — Ambulatory Visit: Payer: Managed Care, Other (non HMO) | Admitting: Adult Health

## 2019-09-17 ENCOUNTER — Encounter: Payer: Self-pay | Admitting: Internal Medicine

## 2019-09-17 VITALS — BP 130/70 | HR 90 | Temp 98.1°F | Resp 16 | Ht 68.0 in | Wt 148.0 lb

## 2019-09-17 DIAGNOSIS — G4700 Insomnia, unspecified: Secondary | ICD-10-CM

## 2019-09-17 DIAGNOSIS — Z79899 Other long term (current) drug therapy: Secondary | ICD-10-CM

## 2019-09-17 DIAGNOSIS — F1410 Cocaine abuse, uncomplicated: Secondary | ICD-10-CM

## 2019-09-17 DIAGNOSIS — F419 Anxiety disorder, unspecified: Secondary | ICD-10-CM

## 2019-09-17 DIAGNOSIS — R634 Abnormal weight loss: Secondary | ICD-10-CM

## 2019-09-17 LAB — POCT URINE DRUG SCREEN
POC Amphetamine UR: NOT DETECTED
POC BENZODIAZEPINES UR: NOT DETECTED
POC Barbiturate UR: NOT DETECTED
POC Cocaine UR: POSITIVE — AB
POC Ecstasy UR: NOT DETECTED
POC Marijuana UR: POSITIVE — AB
POC Methadone UR: NOT DETECTED
POC Methamphetamine UR: NOT DETECTED
POC Opiate Ur: NOT DETECTED
POC Oxycodone UR: NOT DETECTED
POC PHENCYCLIDINE UR: NOT DETECTED
POC TRICYCLICS UR: NOT DETECTED

## 2019-09-17 NOTE — Progress Notes (Signed)
[facility] [address] [place], [place] [address]  Internal MEDICINE  Office Visit Note  Patient Name: Dylan Evans  [id number]  [id number]  Date of Service: 09/17/2019  Chief Complaint  Patient presents with  . Depression  . Follow-up    mood swings      HPI Pt is here for a sick visit. Patient is here with his mother in exam room.  He reports for the past few months he has been using cocaine. His mother is aware, and they have been seeking counseling.  He was clean for 25 days, and had a relapse.  His mother is concerned that he may need inpatient treatment.  They are here today because his counselor is out of town.     Current Medication:  Outpatient Encounter Medications as of 09/17/2019  Medication Sig  . escitalopram (LEXAPRO) 10 MG tablet Take 1 tablet (10 mg total) by mouth daily.  Marland Kitchen escitalopram (LEXAPRO) 20 MG tablet Take 1 tablet (20 mg total) by mouth daily.  . hydrOXYzine (ATARAX/VISTARIL) 25 MG tablet Take 1-2 tablets (25-50 mg total) by mouth at bedtime as needed.  . loratadine (CLARITIN) 10 MG tablet Take 10 mg by mouth daily.   No facility-administered encounter medications on file as of 09/17/2019.      Medical History: History reviewed. No pertinent past medical history.   Vital Signs: BP 130/70   Pulse 90   Temp 98.1 F (36.7 C)   Resp 16   Ht 5\' 8"  (1.727 m)   Wt 148 lb (67.1 kg)   SpO2 98%   BMI 22.50 kg/m    Review of Systems  Constitutional: Negative.  Negative for chills, fatigue and unexpected weight change.  HENT: Negative.  Negative for congestion, rhinorrhea, sneezing and sore throat.   Eyes: Negative for redness.  Respiratory: Negative.  Negative for cough, chest tightness and shortness of breath.   Cardiovascular: Negative.  Negative for chest pain and palpitations.  Gastrointestinal: Negative.  Negative for abdominal pain, constipation, diarrhea, nausea and vomiting.  Endocrine: Negative.   Genitourinary:  Negative.  Negative for dysuria and frequency.  Musculoskeletal: Negative.  Negative for arthralgias, back pain, joint swelling and neck pain.  Skin: Negative.  Negative for rash.  Allergic/Immunologic: Negative.   Neurological: Negative.  Negative for tremors and numbness.  Hematological: Negative for adenopathy. Does not bruise/bleed easily.  Psychiatric/Behavioral: Negative.  Negative for behavioral problems, sleep disturbance and suicidal ideas. The patient is not nervous/anxious.     Physical Exam Vitals and nursing note reviewed.  Constitutional:      General: He is not in acute distress.    Appearance: He is well-developed. He is not diaphoretic.  HENT:     Head: Normocephalic and atraumatic.     Mouth/Throat:     Pharynx: No oropharyngeal exudate.  Eyes:     Pupils: Pupils are equal, round, and reactive to light.  Neck:     Thyroid: No thyromegaly.     Vascular: No JVD.     Trachea: No tracheal deviation.  Cardiovascular:     Rate and Rhythm: Normal rate and regular rhythm.     Heart sounds: Normal heart sounds. No murmur. No friction rub. No gallop.   Pulmonary:     Effort: Pulmonary effort is normal. No respiratory distress.     Breath sounds: Normal breath sounds. No wheezing or rales.  Chest:     Chest wall: No tenderness.  Abdominal:     Palpations: Abdomen is  soft.  Musculoskeletal:        General: Normal range of motion.     Cervical back: Normal range of motion and neck supple.  Lymphadenopathy:     Cervical: No cervical adenopathy.  Skin:    General: Skin is warm and dry.  Neurological:     Mental Status: He is alert and oriented to person, place, and time.     Cranial Nerves: No cranial nerve deficit.  Psychiatric:        Behavior: Behavior normal.        Thought Content: Thought content normal.        Judgment: Judgment normal.    Assessment/Plan: 1. Drug abuse, cocaine type [facility]) After long discussion with patient and mother, he will see his  counselor next week as scheduled.  We discussed in patient rehab, and the dangers of drug abuse, and driving with impaired. He is interested in [place].  He is dissapointed that he failed. Discussed going to ED if he had withdrawal symptoms which are not likely since he has used one time in the last 28 days.      2. Anxiety Encouraged patient to increase dose of lexapro to 20mg  as discussed in previous visit.   3. Insomnia, unspecified type Likely due to cocaine use, not having isssues currently.   4. Abnormal weight loss Looking back, this was due to cocaine abuse, that he had not shared with provider.   5. Encounter for long-term (current) use of medications Positive for cocaine, and THC - POCT Urine Drug Screen  General Counseling: Dagmawi verbalizes understanding of the findings of todays visit and agrees with plan of treatment. I have discussed any further diagnostic evaluation that may be needed or ordered today. We also reviewed his medications today. he has been encouraged to call the office with any questions or concerns that should arise related to todays visit.   Orders Placed This Encounter  Procedures  . POCT Urine Drug Screen    No orders of the defined types were placed in this encounter.   Time spent:30 Minutes  This patient was seen by Orson Gear AGNP-C in Collaboration with Dr Lavera Guise as a part of collaborative care agreement.  Kendell Bane AGNP-C Internal Medicine

## 2019-09-19 ENCOUNTER — Telehealth: Payer: Self-pay

## 2019-09-19 NOTE — Telephone Encounter (Signed)
Confirmed appointment on 09/23/2019 and screened for covid. klh 

## 2019-09-23 ENCOUNTER — Ambulatory Visit: Payer: Managed Care, Other (non HMO) | Admitting: Adult Health

## 2019-10-02 ENCOUNTER — Other Ambulatory Visit: Payer: Self-pay

## 2019-10-10 ENCOUNTER — Telehealth: Payer: Self-pay

## 2019-10-10 NOTE — Telephone Encounter (Signed)
Patient cancelled appointment on 10/14/2019. klh

## 2019-10-14 ENCOUNTER — Ambulatory Visit: Payer: Managed Care, Other (non HMO) | Admitting: Adult Health

## 2019-11-03 ENCOUNTER — Other Ambulatory Visit: Payer: Self-pay

## 2019-11-03 ENCOUNTER — Emergency Department
Admission: EM | Admit: 2019-11-03 | Discharge: 2019-11-03 | Disposition: A | Discharge status: Home / Self Care | Payer: Managed Care, Other (non HMO) | Attending: Emergency Medicine | Admitting: Emergency Medicine

## 2019-11-03 ENCOUNTER — Emergency Department: Payer: Managed Care, Other (non HMO)

## 2019-11-03 DIAGNOSIS — R569 Unspecified convulsions: Secondary | ICD-10-CM

## 2019-11-03 DIAGNOSIS — E162 Hypoglycemia, unspecified: Secondary | ICD-10-CM | POA: Insufficient documentation

## 2019-11-03 DIAGNOSIS — F1293 Cannabis use, unspecified with withdrawal: Secondary | ICD-10-CM | POA: Insufficient documentation

## 2019-11-03 DIAGNOSIS — F17210 Nicotine dependence, cigarettes, uncomplicated: Secondary | ICD-10-CM | POA: Insufficient documentation

## 2019-11-03 DIAGNOSIS — R55 Syncope and collapse: Secondary | ICD-10-CM | POA: Diagnosis not present

## 2019-11-03 DIAGNOSIS — S0990XA Unspecified injury of head, initial encounter: Secondary | ICD-10-CM

## 2019-11-03 LAB — URINE DRUG SCREEN, QUALITATIVE (ARMC ONLY)
Amphetamines, Ur Screen: NOT DETECTED
Barbiturates, Ur Screen: NOT DETECTED
Benzodiazepine, Ur Scrn: NOT DETECTED
Cannabinoid 50 Ng, Ur ~~LOC~~: POSITIVE — AB
Cocaine Metabolite,Ur ~~LOC~~: NOT DETECTED
MDMA (Ecstasy)Ur Screen: NOT DETECTED
Methadone Scn, Ur: NOT DETECTED
Opiate, Ur Screen: NOT DETECTED
Phencyclidine (PCP) Ur S: NOT DETECTED
Tricyclic, Ur Screen: NOT DETECTED

## 2019-11-03 LAB — CBC
HCT: 39.5 % (ref 39.0–52.0)
Hemoglobin: 14 g/dL (ref 13.0–17.0)
MCH: 29.9 pg (ref 26.0–34.0)
MCHC: 35.4 g/dL (ref 30.0–36.0)
MCV: 84.4 fL (ref 80.0–100.0)
Platelets: 178 10*3/uL (ref 150–400)
RBC: 4.68 MIL/uL (ref 4.22–5.81)
RDW: 12.4 % (ref 11.5–15.5)
WBC: 8.4 10*3/uL (ref 4.0–10.5)
nRBC: 0 % (ref 0.0–0.2)

## 2019-11-03 LAB — COMPREHENSIVE METABOLIC PANEL
ALT: 16 U/L (ref 0–44)
AST: 36 U/L (ref 15–41)
Albumin: 4.6 g/dL (ref 3.5–5.0)
Alkaline Phosphatase: 60 U/L (ref 38–126)
Anion gap: 17 — ABNORMAL HIGH (ref 5–15)
BUN: 9 mg/dL (ref 6–20)
CO2: 17 mmol/L — ABNORMAL LOW (ref 22–32)
Calcium: 9 mg/dL (ref 8.9–10.3)
Chloride: 108 mmol/L (ref 98–111)
Creatinine, Ser: 1.35 mg/dL — ABNORMAL HIGH (ref 0.61–1.24)
GFR calc Af Amer: >60 mL/min (ref >60)
GFR calc non Af Amer: >60 mL/min (ref >60)
Glucose, Bld: 104 mg/dL — ABNORMAL HIGH (ref 70–99)
Potassium: 3.7 mmol/L (ref 3.5–5.1)
Sodium: 142 mmol/L (ref 135–145)
Total Bilirubin: 0.8 mg/dL (ref 0.3–1.2)
Total Protein: 7.2 g/dL (ref 6.5–8.1)

## 2019-11-03 MED ORDER — KETOROLAC TROMETHAMINE 30 MG/ML IJ SOLN
15.0000 mg | Freq: Once | INTRAMUSCULAR | Status: AC
  Administered 2019-11-03: 15 mg via INTRAVENOUS

## 2019-11-03 MED ORDER — SODIUM CHLORIDE 0.9 % IV BOLUS
1000.0000 mL | Freq: Once | INTRAVENOUS | Status: AC
  Administered 2019-11-03: 1000 mL via INTRAVENOUS

## 2019-11-03 NOTE — ED Triage Notes (Signed)
Pt arrives via EMS from work at [employer] when coworkers stated that they saw seizure like activity- pt does not have a hx of seizures but does have a hx of cocaine use- small lac noted to the right side of his forehead- cbg 112

## 2019-11-03 NOTE — Discharge Instructions (Signed)
Please call and schedule an appointment with neurology.  Take tylenol or ibuprofen if needed for headache.  Return to the ER for symptoms that change or worsen if unable to schedule an appointment with primary care or the neurologist.

## 2019-11-03 NOTE — ED Provider Notes (Signed)
[facility] Emergency Department Provider Note ____________________________________________   None    (approximate)  I have reviewed the triage vital signs and the nursing notes.   HISTORY  Chief Complaint Seizures  HPI Dylan Evans is a 24 y.o. male who presents to the emergency department for treatment and evaluation after seizure like activity while at work. Coworkers report he began shaking then fell to the floor. No documented history of seizures, but patient feels like he has had seizures in the past associated with cocaine use. Last cocaine use was 15 days ago. He denies recent use of any illicit substance. He denies pain at this time. He feels "normal" now.      History reviewed. No pertinent past medical history.  There are no problems to display for this patient.   Past Surgical History:  Procedure Laterality Date   tubes in ears      Prior to Admission medications   Medication Sig Start Date End Date Taking? Authorizing Provider  escitalopram (LEXAPRO) 10 MG tablet Take 1 tablet (10 mg total) by mouth daily. 08/19/19   Johnna Acosta, NP  escitalopram (LEXAPRO) 20 MG tablet Take 1 tablet (20 mg total) by mouth daily. 08/19/19   Johnna Acosta, NP  hydrOXYzine (ATARAX/VISTARIL) 25 MG tablet Take 1-2 tablets (25-50 mg total) by mouth at bedtime as needed. 08/19/19   Johnna Acosta, NP  loratadine (CLARITIN) 10 MG tablet Take 10 mg by mouth daily.    [provider]    Allergies Patient has no known allergies.  Family History  Problem Relation Age of Onset   Stomach cancer Maternal Grandmother    Interstitial cystitis Maternal Grandfather    Stomach cancer Paternal Grandmother     Social History Social History   Tobacco Use   Smoking status: Current Some Day Smoker    Types: Cigarettes   Smokeless tobacco: Never Used   Tobacco comment: 1 or 2  a week  Substance Use Topics   Alcohol use: Yes    Comment: 2-3  days month   Drug use: Yes    Types: Cocaine    Review of Systems  Constitutional: No fever/chills Eyes: No visual changes. ENT: No sore throat. Cardiovascular: Denies chest pain. Respiratory: Denies shortness of breath. Gastrointestinal: No abdominal pain.  No nausea, no vomiting.  No diarrhea.  No constipation. Genitourinary: Negative for dysuria. Musculoskeletal: Negative for back pain. Skin: Negative for rash. Neurological: Negative for headaches, focal weakness or numbness. ____________________________________________   PHYSICAL EXAM:  VITAL SIGNS: ED Triage Vitals  Enc Vitals Group     BP 11/03/19 1741 127/72     Pulse Rate 11/03/19 1741 85     Resp 11/03/19 1741 (!) 24     Temp --      Temp src --      SpO2 11/03/19 1741 99 %     Weight 11/03/19 1738 170 lb (77.1 kg)     Height 11/03/19 1738 5\' 9"  (1.753 m)     Head Circumference --      Peak Flow --      Pain Score 11/03/19 1737 0     Pain Loc --      Pain Edu? --      Excl. in GC? --     Constitutional: Alert and oriented. Well appearing and in no acute distress. Eyes: Conjunctivae are normal. PERRL. EOMI. Pupils 27mm, equal Head: Forehead hematoma and superficial laceration. Nose: No congestion/rhinnorhea. Mouth/Throat: Mucous  membranes are moist.  Oropharynx non-erythematous. Neck: No stridor.   Hematological/Lymphatic/Immunilogical: No cervical lymphadenopathy. Cardiovascular: Normal rate, regular rhythm. Grossly normal heart sounds.  Good peripheral circulation. Respiratory: Normal respiratory effort.  No retractions. Lungs CTAB. Gastrointestinal: Soft and nontender. No distention. No abdominal bruits. No CVA tenderness. Genitourinary:  Musculoskeletal: No lower extremity tenderness nor edema.  No joint effusions. Neurologic:  Normal speech and language. No gross focal neurologic deficits are appreciated. No gait instability. Skin:  Skin is warm, dry and intact. No rash noted. Psychiatric: Mood and  affect are normal. Speech and behavior are normal.  ____________________________________________   LABS (all labs ordered are listed, but only abnormal results are displayed)  Labs Reviewed  COMPREHENSIVE METABOLIC PANEL - Abnormal; Notable for the following components:      Result Value   CO2 17 (*)    Glucose, Bld 104 (*)    Creatinine, Ser 1.35 (*)    Anion gap 17 (*)    All other components within normal limits  URINE DRUG SCREEN, QUALITATIVE ([facility] ONLY) - Abnormal; Notable for the following components:   Cannabinoid 50 Ng, Ur [employer] POSITIVE (*)    All other components within normal limits  CBC   ____________________________________________  EKG  ED ECG REPORT I, Belita Warsame, FNP-BC personally viewed and interpreted this ECG.   Date: 11/03/2019  EKG Time: 1741  Rate: 86  Rhythm: normal sinus rhythm  Axis: normal  Intervals:none  ST&T Change: no ST elevation.  ____________________________________________  RADIOLOGY  ED MD interpretation:     I, Kem Boroughs, personally viewed and evaluated these images (plain radiographs) as part of my medical decision making, as well as reviewing the written report by the radiologist.  Official radiology report(s): CT Head Wo Contrast  Result Date: 11/03/2019 CLINICAL DATA:  Minor head trauma. Not an abrasion on right-sided forehead. EXAM: CT HEAD WITHOUT CONTRAST TECHNIQUE: Contiguous axial images were obtained from the base of the skull through the vertex without intravenous contrast. COMPARISON:  None. FINDINGS: Brain: No evidence of acute infarction, hemorrhage, hydrocephalus, extra-axial collection or mass lesion/mass effect. Vascular: No hyperdense vessel or unexpected calcification. Skull: Normal. Negative for fracture or focal lesion. Sinuses/Orbits: No acute finding. Other: Mild soft tissue swelling over the right lateral forehead. IMPRESSION: No acute intracranial abnormalities. Electronically Signed   By: Gerome Sam  III M.D   On: 11/03/2019 18:36    ____________________________________________   PROCEDURES  Procedure(s) performed (including Critical Care):  Procedures  ____________________________________________   INITIAL IMPRESSION / ASSESSMENT AND PLAN     24 year old male presenting to the emergency department after coworkers observed seizure-like activity.  At this time, patient is not postictal.  He is currently alert, oriented and states that he feels "normal."  He did strike his head and does not know a cause that he would lose consciousness or have a seizure.  Last use of cocaine was 15 days ago.  He states that when he has these episodes it typically after cocaine use.  DIFFERENTIAL DIAGNOSIS  Syncope, new onset seizures, transient hypoglycemia, substance use, substance withdrawal  ED COURSE  ----------------------------------------- 7:18 PM on 11/03/2019 -----------------------------------------  Patient remains awake, alert, oriented.  He now complains of a pretty significant headache.  CT scan is normal.  Mother is now at bedside.  She states that he has had multiple syncopal/seizure like episodes in the past.  She is also well aware of his cocaine abuse and states that he is scheduled with a psychiatrist as well as a  counselor.  Mother states he used cocaine last Saturday.  He states he is currently taking Lexapro but still has episodes of anxiety.  She states that he has had insomnia for several years.  He is prescribed Atarax and states that he takes Advil PM with the Atarax but still only gets 1 to 2 hours of sleep per night.  ----------------------------------------- 9:04 PM on 11/03/2019 -----------------------------------------  Patient to be discharged home. Labs are reassuring. Urine drug screen shows marijuana. He is to follow up with neurology. Mom at bedside and states that she will call on Tuesday for an appointment. Patient advised to return to the ER for symptoms  of concern. Head injury instructions discussed. ____________________________________________   FINAL CLINICAL IMPRESSION(S) / ED DIAGNOSES  Final diagnoses:  Seizure-like activity (Center)  Syncope, unspecified syncope type  Minor head injury, initial encounter     ED Discharge Orders    None       Dylan Evans was evaluated in Emergency Department on 11/03/2019 for the symptoms described in the history of present illness. He was evaluated in the context of the global COVID-19 pandemic, which necessitated consideration that the patient might be at risk for infection with the SARS-CoV-2 virus that causes COVID-19. Institutional protocols and algorithms that pertain to the evaluation of patients at risk for COVID-19 are in a state of rapid change based on information released by regulatory bodies including the CDC and federal and state organizations. These policies and algorithms were followed during the patient's care in the ED.   Note:  This document was prepared using Dragon voice recognition software and may include unintentional dictation errors.   Victorino Dike, FNP 11/03/19 2106    Earleen Newport, MD 11/03/19 2306

## 2019-11-11 DIAGNOSIS — R569 Unspecified convulsions: Secondary | ICD-10-CM | POA: Insufficient documentation

## 2019-11-13 ENCOUNTER — Other Ambulatory Visit: Payer: Self-pay

## 2019-11-13 DIAGNOSIS — F419 Anxiety disorder, unspecified: Secondary | ICD-10-CM

## 2019-11-13 MED ORDER — ESCITALOPRAM OXALATE 20 MG PO TABS: 20.0000 mg | ORAL_TABLET | Freq: Every day | ORAL | 0 refills | Status: DC

## 2019-12-13 ENCOUNTER — Other Ambulatory Visit: Payer: Self-pay

## 2019-12-13 ENCOUNTER — Other Ambulatory Visit: Payer: Self-pay | Admitting: Adult Health

## 2019-12-13 DIAGNOSIS — F419 Anxiety disorder, unspecified: Secondary | ICD-10-CM

## 2019-12-13 DIAGNOSIS — G4700 Insomnia, unspecified: Secondary | ICD-10-CM

## 2019-12-13 MED ORDER — ESCITALOPRAM OXALATE 20 MG PO TABS: 20.0000 mg | ORAL_TABLET | Freq: Every day | ORAL | 0 refills | Status: DC

## 2019-12-13 NOTE — Telephone Encounter (Signed)
Pt need appt for refills  ?

## 2019-12-25 ENCOUNTER — Other Ambulatory Visit: Payer: Self-pay

## 2019-12-25 ENCOUNTER — Ambulatory Visit: Payer: Managed Care, Other (non HMO) | Admitting: Adult Health

## 2019-12-25 ENCOUNTER — Encounter: Payer: Self-pay | Admitting: Adult Health

## 2019-12-25 VITALS — BP 120/51 | HR 66 | Temp 97.8°F | Resp 16 | Ht 68.0 in | Wt 154.1 lb

## 2019-12-25 DIAGNOSIS — F419 Anxiety disorder, unspecified: Secondary | ICD-10-CM

## 2019-12-25 DIAGNOSIS — G4700 Insomnia, unspecified: Secondary | ICD-10-CM

## 2019-12-25 NOTE — Progress Notes (Signed)
[facility] [address] [place], [place] [address]  Internal MEDICINE  Office Visit Note  Patient Name: Dylan Evans  [id number]  [id number]  Date of Service: 12/25/2019  Chief Complaint  Patient presents with  . Follow-up    med f/u  . Quality Metric Gaps    covid-19 vaccine, TDAP    HPI  Pt is here for follow up. overall he is doing well.  He is working a new job and is working 6am -6pm.  He is doing good avoiding drug use at this time.  He is doing well on Lexapro 20mg .  He is jovial, and appears at his baseline today. He is sleeping well at this time.         Current Medication: Outpatient Encounter Medications as of 12/25/2019  Medication Sig  . escitalopram (LEXAPRO) 20 MG tablet Take 1 tablet (20 mg total) by mouth daily.  . hydrOXYzine (ATARAX/VISTARIL) 25 MG tablet TAKE 1 TO 2 TABLETS(25 TO 50 MG) BY MOUTH AT BEDTIME AS NEEDED  . loratadine (CLARITIN) 10 MG tablet Take 10 mg by mouth daily.  . [DISCONTINUED] escitalopram (LEXAPRO) 10 MG tablet Take 1 tablet (10 mg total) by mouth daily.   No facility-administered encounter medications on file as of 12/25/2019.    Surgical History: Past Surgical History:  Procedure Laterality Date  . tubes in ears      Medical History: History reviewed. No pertinent past medical history.  Family History: Family History  Problem Relation Age of Onset  . Stomach cancer Maternal Grandmother   . Interstitial cystitis Maternal Grandfather   . Stomach cancer Paternal Grandmother     Social History   Socioeconomic History  . Marital status: Single    Spouse name: Not on file  . Number of children: Not on file  . Years of education: Not on file  . Highest education level: Not on file  Occupational History  . Not on file  Tobacco Use  . Smoking status: Current Some Day Smoker    Types: Cigarettes  . Smokeless tobacco: Never Used  . Tobacco comment: 1 or 2  a week  Vaping Use  . Vaping Use: Never used   Substance and Sexual Activity  . Alcohol use: Not Currently    Comment: 2-3 days month  . Drug use: Not Currently    Types: Cocaine  . Sexual activity: Not on file  Other Topics Concern  . Not on file  Social History Narrative  . Not on file   Social Determinants of Health   Financial Resource Strain:   . Difficulty of Paying Living Expenses:   Food Insecurity:   . Worried About 12/27/2019 in the Last Year:   . Programme researcher, broadcasting/film/video in the Last Year:   Transportation Needs:   . Barista (Medical):   Freight forwarder Lack of Transportation (Non-Medical):   Physical Activity:   . Days of Exercise per Week:   . Minutes of Exercise per Session:   Stress:   . Feeling of Stress :   Social Connections:   . Frequency of Communication with Friends and Family:   . Frequency of Social Gatherings with Friends and Family:   . Attends Religious Services:   . Active Member of Clubs or Organizations:   . Attends [employer] Meetings:   Banker Marital Status:   Intimate Partner Violence:   . Fear of Current or Ex-Partner:   . Emotionally Abused:   .  Physically Abused:   . Sexually Abused:       Review of Systems  Constitutional: Negative.  Negative for chills, fatigue and unexpected weight change.  HENT: Negative.  Negative for congestion, rhinorrhea, sneezing and sore throat.   Eyes: Negative for redness.  Respiratory: Negative.  Negative for cough, chest tightness and shortness of breath.   Cardiovascular: Negative.  Negative for chest pain and palpitations.  Gastrointestinal: Negative.  Negative for abdominal pain, constipation, diarrhea, nausea and vomiting.  Endocrine: Negative.   Genitourinary: Negative.  Negative for dysuria and frequency.  Musculoskeletal: Negative.  Negative for arthralgias, back pain, joint swelling and neck pain.  Skin: Negative.  Negative for rash.  Allergic/Immunologic: Negative.   Neurological: Negative.  Negative for tremors and numbness.   Hematological: Negative for adenopathy. Does not bruise/bleed easily.  Psychiatric/Behavioral: Negative.  Negative for behavioral problems, sleep disturbance and suicidal ideas. The patient is not nervous/anxious.     Vital Signs: BP (!) 120/51   Pulse 66   Temp 97.8 F (36.6 C)   Resp 16   Ht 5\' 8"  (1.727 m)   Wt 154 lb 1.6 oz (69.9 kg)   SpO2 99%   BMI 23.43 kg/m    Physical Exam Vitals and nursing note reviewed.  Constitutional:      General: He is not in acute distress.    Appearance: He is well-developed. He is not diaphoretic.  HENT:     Head: Normocephalic and atraumatic.     Mouth/Throat:     Pharynx: No oropharyngeal exudate.  Eyes:     Pupils: Pupils are equal, round, and reactive to light.  Neck:     Thyroid: No thyromegaly.     Vascular: No JVD.     Trachea: No tracheal deviation.  Cardiovascular:     Rate and Rhythm: Normal rate and regular rhythm.     Heart sounds: Normal heart sounds. No murmur heard.  No friction rub. No gallop.   Pulmonary:     Effort: Pulmonary effort is normal. No respiratory distress.     Breath sounds: Normal breath sounds. No wheezing or rales.  Chest:     Chest wall: No tenderness.  Abdominal:     Palpations: Abdomen is soft.     Tenderness: There is no abdominal tenderness. There is no guarding.  Musculoskeletal:        General: Normal range of motion.     Cervical back: Normal range of motion and neck supple.  Lymphadenopathy:     Cervical: No cervical adenopathy.  Skin:    General: Skin is warm and dry.  Neurological:     Mental Status: He is alert and oriented to person, place, and time.     Cranial Nerves: No cranial nerve deficit.  Psychiatric:        Behavior: Behavior normal.        Thought Content: Thought content normal.        Judgment: Judgment normal.    Assessment/Plan: 1. Anxiety Continue with Lexapro as discussed.  Good relief of symptoms currently.    2. Insomnia, unspecified type Symptoms have  resolved, now that patient is working, exercising and not using cocaine.  General Counseling: Heaven verbalizes understanding of the findings of todays visit and agrees with plan of treatment. I have discussed any further diagnostic evaluation that may be needed or ordered today. We also reviewed his medications today. he has been encouraged to call the office with any questions or concerns that should arise  related to todays visit.    No orders of the defined types were placed in this encounter.   No orders of the defined types were placed in this encounter.   Time spent: 30 Minutes   This patient was seen by Blima Ledger AGNP-C in Collaboration with Dr Lyndon Code as a part of collaborative care agreement     Johnna Acosta AGNP-C Internal medicine

## 2020-03-25 ENCOUNTER — Telehealth: Payer: Self-pay

## 2020-03-25 NOTE — Telephone Encounter (Signed)
Confirmed and screened for 03-26-20 ov. 

## 2020-03-26 ENCOUNTER — Encounter: Payer: Self-pay | Admitting: Hospice and Palliative Medicine

## 2020-03-26 ENCOUNTER — Other Ambulatory Visit: Payer: Self-pay

## 2020-03-26 ENCOUNTER — Ambulatory Visit: Payer: Managed Care, Other (non HMO) | Admitting: Hospice and Palliative Medicine

## 2020-03-26 DIAGNOSIS — Z0001 Encounter for general adult medical examination with abnormal findings: Secondary | ICD-10-CM

## 2020-03-26 DIAGNOSIS — F411 Generalized anxiety disorder: Secondary | ICD-10-CM

## 2020-03-26 DIAGNOSIS — G4700 Insomnia, unspecified: Secondary | ICD-10-CM | POA: Diagnosis not present

## 2020-03-26 NOTE — Progress Notes (Signed)
[facility] [address] [place], [place] [address]  Internal MEDICINE  Office Visit Note  Patient Name: Dylan Evans  [id number]  [id number]  Date of Service: 03/29/2020  Chief Complaint  Patient presents with  . Follow-up    doesnt feel he needs lexapro, covid vaccine, vasovagal sympathy  . Anxiety  . policy update form    received  . Quality Metric Gaps    covid vaccine    HPI Patient is here for routine follow-up He has been taking Lexapro for anxiety for a few months, was recently increased to 20 mg in April, he reports he has not been taking his Lexapro for about 6-8 weeks because he felt as though he no longer needs it Says since stopping his Lexapro he continues to feel well and that is anxiety is well controlled without medication  He is starting a new job tonight working in [employer] and is excited about this, says things in his life seem to falling into place He is having no issues sleeping, does continue to take hydroxyzine at night to help his sleep but says this continues to work and he has no difficulties with sleep   Current Medication: Outpatient Encounter Medications as of 03/26/2020  Medication Sig  . hydrOXYzine (ATARAX/VISTARIL) 25 MG tablet TAKE 1 TO 2 TABLETS(25 TO 50 MG) BY MOUTH AT BEDTIME AS NEEDED  . escitalopram (LEXAPRO) 20 MG tablet Take 1 tablet (20 mg total) by mouth daily. (Patient not taking: Reported on 03/26/2020)  . [DISCONTINUED] loratadine (CLARITIN) 10 MG tablet Take 10 mg by mouth daily. (Patient not taking: Reported on 03/26/2020)   No facility-administered encounter medications on file as of 03/26/2020.    Surgical History: Past Surgical History:  Procedure Laterality Date  . tubes in ears      Medical History: Past Medical History:  Diagnosis Date  . Anxiety     Family History: Family History  Problem Relation Age of Onset  . Stomach cancer Maternal Grandmother   . Interstitial cystitis Maternal  Grandfather   . Stomach cancer Paternal Grandmother     Social History   Socioeconomic History  . Marital status: Single    Spouse name: Not on file  . Number of children: Not on file  . Years of education: Not on file  . Highest education level: Not on file  Occupational History  . Not on file  Tobacco Use  . Smoking status: Current Some Day Smoker    Types: Cigarettes  . Smokeless tobacco: Never Used  . Tobacco comment: 1 or 2  a week  Vaping Use  . Vaping Use: Never used  Substance and Sexual Activity  . Alcohol use: Yes    Alcohol/week: 5.0 - 6.0 standard drinks    Types: 5 - 6 Cans of beer per week    Comment: on the weekend  . Drug use: Not Currently    Types: Cocaine  . Sexual activity: Not on file  Other Topics Concern  . Not on file  Social History Narrative  . Not on file   Social Determinants of Health   Financial Resource Strain:   . Difficulty of Paying Living Expenses: Not on file  Food Insecurity:   . Worried About Programme researcher, broadcasting/film/video in the Last Year: Not on file  . Ran Out of Food in the Last Year: Not on file  Transportation Needs:   . Lack of Transportation (Medical): Not on file  . Lack of Transportation (  Non-Medical): Not on file  Physical Activity:   . Days of Exercise per Week: Not on file  . Minutes of Exercise per Session: Not on file  Stress:   . Feeling of Stress : Not on file  Social Connections:   . Frequency of Communication with Friends and Family: Not on file  . Frequency of Social Gatherings with Friends and Family: Not on file  . Attends Religious Services: Not on file  . Active Member of Clubs or Organizations: Not on file  . Attends Banker Meetings: Not on file  . Marital Status: Not on file  Intimate Partner Violence:   . Fear of Current or Ex-Partner: Not on file  . Emotionally Abused: Not on file  . Physically Abused: Not on file  . Sexually Abused: Not on file    Review of Systems  Constitutional:  Negative for chills, diaphoresis, fatigue, fever and unexpected weight change.  HENT: Negative for congestion, postnasal drip, rhinorrhea, sneezing and sore throat.   Eyes: Negative for redness.  Respiratory: Negative for cough, chest tightness and shortness of breath.   Cardiovascular: Negative for chest pain, palpitations and leg swelling.  Gastrointestinal: Negative for abdominal pain, constipation, diarrhea, nausea and vomiting.  Genitourinary: Negative for dysuria and frequency.  Musculoskeletal: Negative for arthralgias, back pain, joint swelling and neck pain.  Skin: Negative for rash.  Neurological: Negative for dizziness, tremors, light-headedness, numbness and headaches.  Hematological: Negative for adenopathy. Does not bruise/bleed easily.  Psychiatric/Behavioral: Negative for behavioral problems (Depression), sleep disturbance and suicidal ideas. The patient is not nervous/anxious.     Vital Signs: BP (!) 116/40   Pulse 91   Temp 98.4 F (36.9 C)   Resp 16   Ht 5\' 8"  (1.727 m)   Wt 153 lb (69.4 kg)   SpO2 98%   BMI 23.26 kg/m    Physical Exam Vitals reviewed.  Constitutional:      Appearance: Normal appearance. He is normal weight.  Cardiovascular:     Rate and Rhythm: Normal rate and regular rhythm.     Pulses: Normal pulses.     Heart sounds: Normal heart sounds.  Pulmonary:     Effort: Pulmonary effort is normal.     Breath sounds: Normal breath sounds.  Musculoskeletal:        General: Normal range of motion.  Skin:    General: Skin is warm.  Neurological:     General: No focal deficit present.     Mental Status: He is alert and oriented to person, place, and time. Mental status is at baseline.  Psychiatric:        Mood and Affect: Mood normal.        Behavior: Behavior normal.        Thought Content: Thought content normal.   Assessment/Plan: 1. Insomnia, unspecified type Insomnia well controlled on hydroxyzine, reports no issues with sleeping,  continue to monitor  2. GAD (generalized anxiety disorder) Stopped Lexapro over a month ago, feels as though his anxiety is well controlled at this time without medication Will need close monitoring, history of recreational drug abuse  3. Encounter for routine adult health examination with abnormal findings Labs ordered for upcoming CPE - CBC w/Diff/Platelet - Comprehensive Metabolic Panel (CMET) - Lipid Panel With LDL/HDL Ratio - TSH + free T4  General Counseling: Montrel verbalizes understanding of the findings of todays visit and agrees with plan of treatment. I have discussed any further diagnostic evaluation that may be needed or  ordered today. We also reviewed his medications today. he has been encouraged to call the office with any questions or concerns that should arise related to todays visit.    Orders Placed This Encounter  Procedures  . CBC w/Diff/Platelet  . Comprehensive Metabolic Panel (CMET)  . Lipid Panel With LDL/HDL Ratio  . TSH + free T4      Time spent: 30 Minutes Time spent includes review of chart, medications, test results and follow-up plan with the patient.  This patient was seen by Leeanne Deed AGNP-C in Collaboration with Dr Lyndon Code as a part of collaborative care agreement     Lubertha Basque. Solana Coggin AGNP-C Internal medicine

## 2020-03-29 ENCOUNTER — Encounter: Payer: Self-pay | Admitting: Hospice and Palliative Medicine

## 2020-05-18 ENCOUNTER — Encounter: Payer: Managed Care, Other (non HMO) | Admitting: Hospice and Palliative Medicine

## 2020-05-19 ENCOUNTER — Other Ambulatory Visit: Payer: Self-pay | Admitting: Hospice and Palliative Medicine

## 2020-05-19 ENCOUNTER — Encounter: Payer: Self-pay | Admitting: Hospice and Palliative Medicine

## 2020-05-19 ENCOUNTER — Ambulatory Visit (INDEPENDENT_AMBULATORY_CARE_PROVIDER_SITE_OTHER): Payer: Managed Care, Other (non HMO) | Admitting: Hospice and Palliative Medicine

## 2020-05-19 ENCOUNTER — Other Ambulatory Visit: Payer: Self-pay

## 2020-05-19 VITALS — BP 136/83 | HR 81 | Temp 98.3°F | Resp 16 | Ht 68.0 in | Wt 151.2 lb

## 2020-05-19 DIAGNOSIS — F411 Generalized anxiety disorder: Secondary | ICD-10-CM

## 2020-05-19 DIAGNOSIS — Z0001 Encounter for general adult medical examination with abnormal findings: Secondary | ICD-10-CM | POA: Diagnosis not present

## 2020-05-19 DIAGNOSIS — G4700 Insomnia, unspecified: Secondary | ICD-10-CM

## 2020-05-19 DIAGNOSIS — K219 Gastro-esophageal reflux disease without esophagitis: Secondary | ICD-10-CM | POA: Diagnosis not present

## 2020-05-19 DIAGNOSIS — F900 Attention-deficit hyperactivity disorder, predominantly inattentive type: Secondary | ICD-10-CM

## 2020-05-19 DIAGNOSIS — R300 Dysuria: Secondary | ICD-10-CM

## 2020-05-19 MED ORDER — OMEPRAZOLE 20 MG PO CPDR: 20.0000 mg | DELAYED_RELEASE_CAPSULE | Freq: Every day | ORAL | 0 refills | Status: DC

## 2020-05-19 MED ORDER — HYDROXYZINE HCL 50 MG PO TABS: 50.0000 mg | ORAL_TABLET | Freq: Three times a day (TID) | ORAL | 1 refills | Status: DC | PRN

## 2020-05-19 MED ORDER — ESCITALOPRAM OXALATE 20 MG PO TABS: 20.0000 mg | ORAL_TABLET | Freq: Every day | ORAL | 2 refills | Status: DC

## 2020-05-19 NOTE — Progress Notes (Signed)
[facility] [address] [place], [place] [address]  Internal MEDICINE  Office Visit Note  Patient Name: Dylan Evans  [id number]  [id number]  Date of Service: 05/22/2020  Chief Complaint  Patient presents with   GI Problem    Has concerns about GI issues ... bothersome when eating.   ADHD    Feels like ADHD has gotten a little worse in the past couple months.   Medication Refill    Needs refills on Hydroxyzine.    HPI Patient is here for routine health maintenance exam Several issues he would like to address today Calls mother to have her on speaker phone during exam 1. Stomach issues-complaints today of excessive belching and having trouble belching at times, gas entrapment causing abdominal cramping, this does seem to be food induced, at restaurants has to get up and walk around due to gas pains--will eventually belch and pain goes away, has not tried anything OTC for his symptoms--says this has been going on since his teenage years, had not gotten worse or more frequent, does not really bother him just wanted to discuss 2. ADHD-He as well as his mother feel he is suffering from ADHD-he has trouble staying focused as well as concentration, he finished this semester of college successfully but struggled with completing assignments and having things turned in on time Mother discusses his history of drug abuse--she is aware that medications used to treat ADHD have the potential to be abused--she will be in charge of providing him with his daily doses of medications Conner Scale completed 6/6 3. Anxiety-Continues to use hydroxyzine only at night to help with sleep and anxiety-was previously on lexapro which helped with his anxiety and stress-stopped taking medication because he was feeling much better   Current Medication: Outpatient Encounter Medications as of 05/19/2020  Medication Sig   [DISCONTINUED] hydrOXYzine (ATARAX/VISTARIL) 25 MG tablet TAKE 1 TO 2  TABLETS(25 TO 50 MG) BY MOUTH AT BEDTIME AS NEEDED   escitalopram (LEXAPRO) 20 MG tablet Take 1 tablet (20 mg total) by mouth daily.   hydrOXYzine (ATARAX/VISTARIL) 50 MG tablet Take 1 tablet (50 mg total) by mouth 3 (three) times daily as needed.   omeprazole (PRILOSEC) 20 MG capsule Take 1 capsule (20 mg total) by mouth daily.   [DISCONTINUED] escitalopram (LEXAPRO) 20 MG tablet Take 1 tablet (20 mg total) by mouth daily. (Patient not taking: No sig reported)   No facility-administered encounter medications on file as of 05/19/2020.    Surgical History: Past Surgical History:  Procedure Laterality Date   tubes in ears      Medical History: Past Medical History:  Diagnosis Date   Anxiety     Family History: Family History  Problem Relation Age of Onset   Stomach cancer Maternal Grandmother    Interstitial cystitis Maternal Grandfather    Stomach cancer Paternal Grandmother     Social History   Socioeconomic History   Marital status: Single    Spouse name: Not on file   Number of children: Not on file   Years of education: Not on file   Highest education level: Not on file  Occupational History   Not on file  Tobacco Use   Smoking status: Current Some Day Smoker    Types: Cigarettes   Smokeless tobacco: Never Used   Tobacco comment: 1 or 2  a week  Vaping Use   Vaping Use: Never used  Substance and Sexual Activity   Alcohol use: Yes  Alcohol/week: 5.0 - 6.0 standard drinks    Types: 5 - 6 Cans of beer per week    Comment: on the weekend   Drug use: Not Currently    Types: Cocaine   Sexual activity: Not on file  Other Topics Concern   Not on file  Social History Narrative   Not on file   Social Determinants of Health   Financial Resource Strain: Not on file  Food Insecurity: Not on file  Transportation Needs: Not on file  Physical Activity: Not on file  Stress: Not on file  Social Connections: Not on file  Intimate Partner  Violence: Not on file      Review of Systems  Constitutional: Negative for chills, fatigue and unexpected weight change.  HENT: Negative for congestion, postnasal drip, rhinorrhea, sneezing and sore throat.   Eyes: Negative for redness.  Respiratory: Negative for cough, chest tightness and shortness of breath.   Cardiovascular: Negative for chest pain and palpitations.  Gastrointestinal: Positive for abdominal pain. Negative for constipation, diarrhea, nausea and vomiting.       Gas pain, excessive belching  Genitourinary: Negative for dysuria and frequency.  Musculoskeletal: Negative for arthralgias, back pain, joint swelling and neck pain.  Skin: Negative for rash.  Neurological: Negative for tremors and numbness.  Hematological: Negative for adenopathy. Does not bruise/bleed easily.  Psychiatric/Behavioral: Positive for decreased concentration and sleep disturbance. Negative for behavioral problems (Depression) and suicidal ideas. The patient is nervous/anxious.     Vital Signs: BP 136/83    Pulse 81    Temp 98.3 F (36.8 C)    Resp 16    Ht 5\' 8"  (1.727 m)    Wt 151 lb 3.2 oz (68.6 kg)    SpO2 98%    BMI 22.99 kg/m    Physical Exam Vitals reviewed.  Constitutional:      Appearance: Normal appearance. He is normal weight.  Cardiovascular:     Rate and Rhythm: Normal rate and regular rhythm.     Pulses: Normal pulses.     Heart sounds: Normal heart sounds.  Pulmonary:     Effort: Pulmonary effort is normal.     Breath sounds: Normal breath sounds.  Abdominal:     General: Abdomen is flat.     Palpations: Abdomen is soft.  Musculoskeletal:        General: Normal range of motion.     Cervical back: Normal range of motion.  Skin:    General: Skin is warm.  Neurological:     General: No focal deficit present.     Mental Status: He is alert and oriented to person, place, and time. Mental status is at baseline.  Psychiatric:        Attention and Perception: Attention  normal.        Mood and Affect: Mood is anxious.        Speech: Speech normal.        Behavior: Behavior normal.     Comments: Fidgeting    Assessment/Plan: 1. Encounter for routine adult health examination with abnormal findings Well appearing 24 year old male Will review routine labs and adjust plan of care as indicated Up to date on PHM - TSH + free T4 - Lipid Panel With LDL/HDL Ratio  2. Gastroesophageal reflux disease without esophagitis Symptoms may be due to underlying reflux--will treat and assess symptoms Advised to take note of foods he is eating before experiencing these symptoms Also encouraged to take over the counter  Beano to see if symptoms improve Will consider GI referral is symptoms persist or have not improved with discussed therapy options - omeprazole (PRILOSEC) 20 MG capsule; Take 1 capsule (20 mg total) by mouth daily.  Dispense: 30 capsule; Refill: 0  3. GAD (generalized anxiety disorder) Start back on Lexapro--discussed the goal is to improve anxiety, will need to continue with therapy even once he is feeling better, advised to not abruptly stop medication, will need assistance with weaning himself off to avoid side effects Discussed with him and his mother that untreated anxiety may exacerbate symptoms of ADHD--will need to control his anxiety - escitalopram (LEXAPRO) 20 MG tablet; Take 1 tablet (20 mg total) by mouth daily.  Dispense: 90 tablet; Refill: 2 - TSH + free T4  4. Insomnia, unspecified type Continue with hydroxyzine as this  - hydrOXYzine (ATARAX/VISTARIL) 50 MG tablet; Take 1 tablet (50 mg total) by mouth 3 (three) times daily as needed.  Dispense: 90 tablet; Refill: 1 - TSH + free T4  5. ADHD, predominantly inattentive type Provided him with adhdnc website to visit and find a provider to be evaluated by Will need appropriate diagnosis and treatment-discussed that once diagnosis was made and stability reached on appropriate treatment follow-up  and refills can be established with PCP High risk for abuse potential due to history  6. Dysuria - UA/M w/rflx Culture, Routine - Microscopic Examination  General Counseling: Dylan Evans verbalizes understanding of the findings of todays visit and agrees with plan of treatment. I have discussed any further diagnostic evaluation that may be needed or ordered today. We also reviewed his medications today. he has been encouraged to call the office with any questions or concerns that should arise related to todays visit.    Orders Placed This Encounter  Procedures   Microscopic Examination   TSH + free T4   Lipid Panel With LDL/HDL Ratio   UA/M w/rflx Culture, Routine    Meds ordered this encounter  Medications   escitalopram (LEXAPRO) 20 MG tablet    Sig: Take 1 tablet (20 mg total) by mouth daily.    Dispense:  90 tablet    Refill:  2   hydrOXYzine (ATARAX/VISTARIL) 50 MG tablet    Sig: Take 1 tablet (50 mg total) by mouth 3 (three) times daily as needed.    Dispense:  90 tablet    Refill:  1   omeprazole (PRILOSEC) 20 MG capsule    Sig: Take 1 capsule (20 mg total) by mouth daily.    Dispense:  30 capsule    Refill:  0    Time spent: 30 Minutes Time spent includes review of chart, medications, test results and follow-up plan with the patient.  This patient was seen by Leeanne Deed AGNP-C in Collaboration with Dr Lyndon Code as a part of collaborative care agreement     Lubertha Basque. Dewight Catino AGNP-C Internal medicine

## 2020-05-20 LAB — MICROSCOPIC EXAMINATION
Bacteria, UA: NONE SEEN
Casts: NONE SEEN /lpf
Epithelial Cells (non renal): NONE SEEN /hpf (ref 0–10)
RBC, Urine: NONE SEEN /hpf (ref 0–2)

## 2020-05-20 LAB — UA/M W/RFLX CULTURE, ROUTINE
Bilirubin, UA: NEGATIVE
Glucose, UA: NEGATIVE
Ketones, UA: NEGATIVE
Leukocytes,UA: NEGATIVE
Nitrite, UA: NEGATIVE
RBC, UA: NEGATIVE
Specific Gravity, UA: 1.022 (ref 1.005–1.030)
Urobilinogen, Ur: 0.2 mg/dL (ref 0.2–1.0)
pH, UA: 6.5 (ref 5.0–7.5)

## 2020-05-22 ENCOUNTER — Encounter: Payer: Self-pay | Admitting: Hospice and Palliative Medicine

## 2020-08-10 ENCOUNTER — Other Ambulatory Visit: Payer: Self-pay

## 2020-09-02 ENCOUNTER — Other Ambulatory Visit: Payer: Self-pay | Admitting: Adult Health

## 2020-09-02 DIAGNOSIS — G4700 Insomnia, unspecified: Secondary | ICD-10-CM

## 2020-09-06 ENCOUNTER — Other Ambulatory Visit: Payer: Self-pay | Admitting: Hospice and Palliative Medicine

## 2020-09-06 DIAGNOSIS — G4700 Insomnia, unspecified: Secondary | ICD-10-CM

## 2020-10-30 ENCOUNTER — Ambulatory Visit: Payer: Managed Care, Other (non HMO) | Admitting: Nurse Practitioner

## 2020-10-30 ENCOUNTER — Encounter: Payer: Self-pay | Admitting: Nurse Practitioner

## 2020-10-30 VITALS — Resp 16 | Ht 68.0 in | Wt 162.0 lb

## 2020-10-30 DIAGNOSIS — U071 COVID-19: Secondary | ICD-10-CM

## 2020-10-30 DIAGNOSIS — R6889 Other general symptoms and signs: Secondary | ICD-10-CM | POA: Diagnosis not present

## 2020-10-30 MED ORDER — AZITHROMYCIN 250 MG PO TABS: ORAL_TABLET | ORAL | 0 refills | Status: DC

## 2020-10-30 NOTE — Progress Notes (Signed)
[facility] [address] [place], [place] [address]  Internal MEDICINE  Telephone Visit  Patient Name: Dylan Evans  [id number]  [id number]  Date of Service: 11/01/2020  I connected with the patient at 12:!5 PM by telephone and verified the patients identity using two identifiers.   I discussed the limitations, risks, security and privacy concerns of performing an evaluation and management service by telephone and the availability of in person appointments. I also discussed with the patient that there may be a patient responsible charge related to the service.  The patient expressed understanding and agrees to proceed.    Chief Complaint  Patient presents with  . Telephone Assessment    [phone]  . Telephone Screen  . Covid Positive    tested positive on Tuesday, was around someone that was positive, symptoms started on wed.  . Sore Throat  . Cough    With mucus (green color)  . Headache    For 2 days  . bodyaches  . Fatigue    HPI  Dylan Evans presents for a virtual tele visit regarding COVID-19. He tested positive on Tuesday, 3 days ago. He was exposed to someone who was positive for COVID and started having symptoms on Wednesday 2 days ago. He reports sore throat, productive cough with green-colored sputum, headache x2 days, body aches and fatigue. He has been taking theraflu OTC for symptomatic relief which is helping. He reports today that he woke up feeling better than he did the past 2 days. He denies any SOB, dyspnea, wheezing, chest tightness, or respiratory distress. His headache is improving and the body aches are less severe today.     Current Medication: Outpatient Encounter Medications as of 10/30/2020  Medication Sig  . azithromycin (ZITHROMAX) 250 MG tablet Take one tab a day for 10 days for uri  . escitalopram (LEXAPRO) 20 MG tablet Take 1 tablet (20 mg total) by mouth daily.  . hydrOXYzine (ATARAX/VISTARIL) 50 MG tablet TAKE 1 TABLET(50 MG) BY  MOUTH THREE TIMES DAILY AS NEEDED  . [DISCONTINUED] omeprazole (PRILOSEC) 20 MG capsule Take 1 capsule (20 mg total) by mouth daily. (Patient not taking: Reported on 10/30/2020)   No facility-administered encounter medications on file as of 10/30/2020.    Surgical History: Past Surgical History:  Procedure Laterality Date  . tubes in ears      Medical History: Past Medical History:  Diagnosis Date  . Anxiety     Family History: Family History  Problem Relation Age of Onset  . Stomach cancer Maternal Grandmother   . Interstitial cystitis Maternal Grandfather   . Stomach cancer Paternal Grandmother     Social History   Socioeconomic History  . Marital status: Single    Spouse name: Not on file  . Number of children: Not on file  . Years of education: Not on file  . Highest education level: Not on file  Occupational History  . Not on file  Tobacco Use  . Smoking status: Former Smoker    Types: Cigarettes    Quit date: 09/30/2020    Years since quitting: 0.0  . Smokeless tobacco: Never Used  . Tobacco comment: 1 or 2  a week  Vaping Use  . Vaping Use: Never used  Substance and Sexual Activity  . Alcohol use: Yes    Alcohol/week: 5.0 - 6.0 standard drinks    Types: 5 - 6 Cans of beer per week    Comment: once or twice a month  . Drug  use: Not Currently    Types: Cocaine  . Sexual activity: Not on file  Other Topics Concern  . Not on file  Social History Narrative  . Not on file   Social Determinants of Health   Financial Resource Strain: Not on file  Food Insecurity: Not on file  Transportation Needs: Not on file  Physical Activity: Not on file  Stress: Not on file  Social Connections: Not on file  Intimate Partner Violence: Not on file      Review of Systems  Constitutional: Positive for chills, fatigue and fever.  HENT: Positive for congestion, postnasal drip, sneezing and sore throat. Negative for rhinorrhea, sinus pressure, sinus pain and trouble  swallowing.   Respiratory: Positive for cough (productive cough with green colored sputum). Negative for choking, chest tightness, shortness of breath and wheezing.   Cardiovascular: Negative for chest pain.  Gastrointestinal: Negative for abdominal pain, diarrhea, nausea and vomiting.  Musculoskeletal: Positive for myalgias.  Neurological: Positive for headaches.    Vital Signs: Resp 16   Ht 5\' 8"  (1.727 m)   Wt 162 lb (73.5 kg)   BMI 24.63 kg/m    Observation/Objective: Dylan Evans appears to be in no acute distress over video and audio visit. He is coughing over video/audio. He sounds congested and hoarse when speaking. He is alert and oriented.     Assessment/Plan: 1. Acute COVID-19 Patient tested positive for COVID 3 days ago. He is symptomatic but symptoms are starting to resolve as of today. Azithromycin prescribed to prevent secondary bacterial infection. Due to lack of respiratory symptoms and current symptoms are improving, oral prednisone was not prescribed. The patient has no comorbidities and is not at any increased risk for his age. Patient instructed to call the clinic if symptoms fail to improve or worsen. Patient encouraged to go to ER if he is experiencing difficulty breathing. - azithromycin (ZITHROMAX) 250 MG tablet; Take one tab a day for 10 days for uri  Dispense: 10 tablet; Refill: 0  2. Flu-like symptoms Patient is experiencing flu-like symptoms after testing positive for COVID-19 3 days ago.     General Counseling: [name] verbalizes understanding of the findings of today's phone visit and agrees with plan of treatment. I have discussed any further diagnostic evaluation that may be needed or ordered today. We also reviewed his medications today. he has been encouraged to call the office with any questions or concerns that should arise related to todays visit.    No orders of the defined types were placed in this encounter.   Meds ordered this encounter   Medications  . azithromycin (ZITHROMAX) 250 MG tablet    Sig: Take one tab a day for 10 days for uri    Dispense:  10 tablet    Refill:  0    Order Specific Question:   Supervising Provider    Answer:   Dylan Evans [[id number]]   Return if symptoms worsen or fail to improve.  Time spent:15 Minutes  This patient was seen by Lyndon Code, FNP-C in collaboration with Dr. Sallyanne Kuster as a part of collaborative care agreement.   Beverely Risen, MSN, FNP-C Internal medicine

## 2020-10-30 NOTE — Progress Notes (Deleted)
  [facility] [address] [place], [place] [address]  Internal MEDICINE  Office Visit Note  Patient Name: Dylan Evans  [id number]  [id number]  Date of Service: 10/30/2020  No chief complaint on file.    HPI Pt is here for a sick visit.     Current Medication:  Outpatient Encounter Medications as of 10/30/2020  Medication Sig  . escitalopram (LEXAPRO) 20 MG tablet Take 1 tablet (20 mg total) by mouth daily.  . hydrOXYzine (ATARAX/VISTARIL) 50 MG tablet TAKE 1 TABLET(50 MG) BY MOUTH THREE TIMES DAILY AS NEEDED  . omeprazole (PRILOSEC) 20 MG capsule Take 1 capsule (20 mg total) by mouth daily.   No facility-administered encounter medications on file as of 10/30/2020.      Medical History: Past Medical History:  Diagnosis Date  . Anxiety      Vital Signs: There were no vitals taken for this visit.   Review of Systems  Physical Exam    Assessment/Plan:   General Counseling: Dylan Evans verbalizes understanding of the findings of todays visit and agrees with plan of treatment. I have discussed any further diagnostic evaluation that may be needed or ordered today. We also reviewed his medications today. he has been encouraged to call the office with any questions or concerns that should arise related to todays visit.    Counseling:    No orders of the defined types were placed in this encounter.   No orders of the defined types were placed in this encounter.   [place] Controlled Substance Database was reviewed by me.  Time spent:*** Minutes

## 2020-12-27 IMAGING — CT CT HEAD W/O CM
3 series · 16 of 47 positions shown, 19 images · non-contrast
Comparison: None.

CLINICAL DATA: Minor head trauma. Not an abrasion on right-sided
forehead.

EXAM:
CT HEAD WITHOUT CONTRAST
TECHNIQUE: Contiguous axial images were obtained from the base of the skull
through the vertex without intravenous contrast.

[Series 2: head wo · axial · 0.42mm/px · z∈[+1331,+1456]mm · 10 of 30 slices shown, 13 images]
[im 3/30  brain]
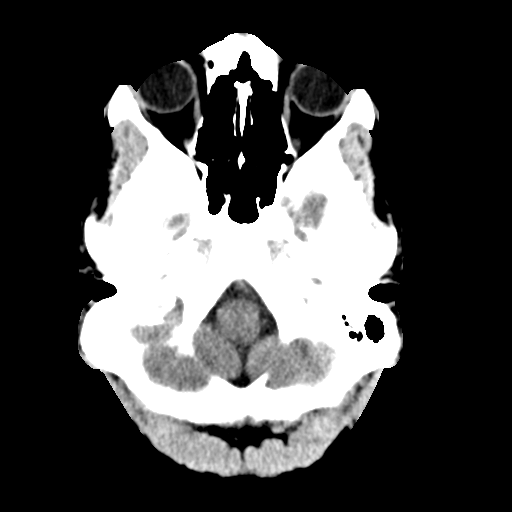
[im 3/30  bone]
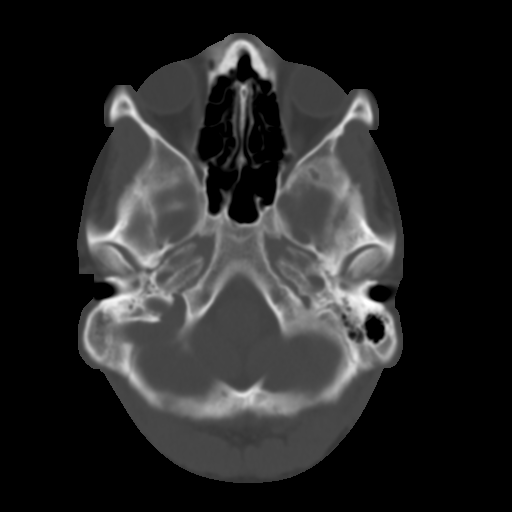
[im 6/30  brain]
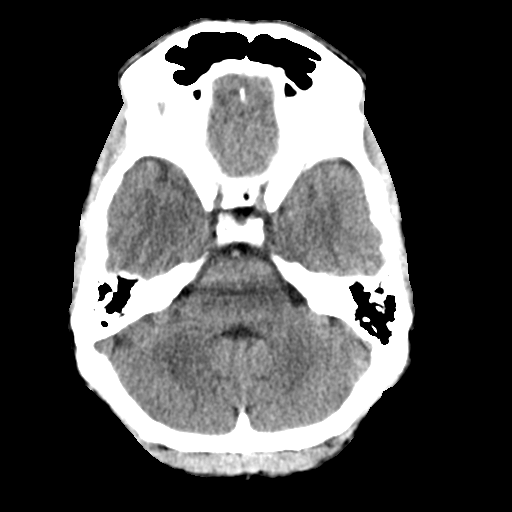
[im 9/30  brain]
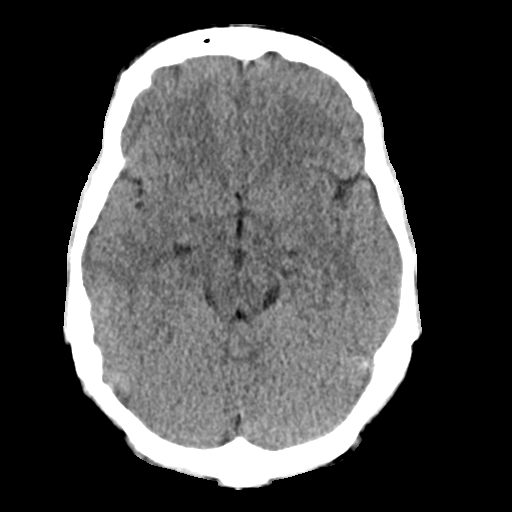
[im 11/30  brain]
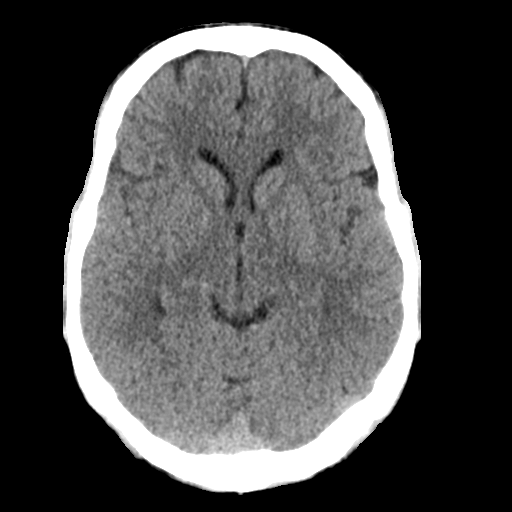
[im 14/30  brain]
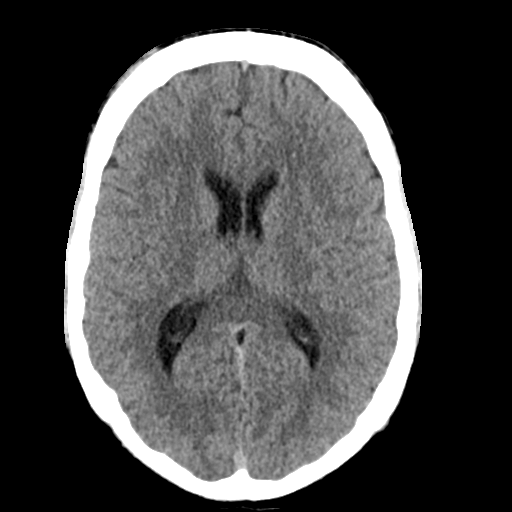
[im 14/30  bone]
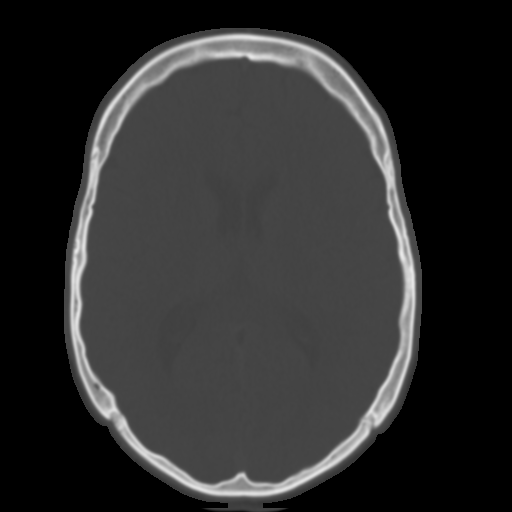
[im 17/30  brain]
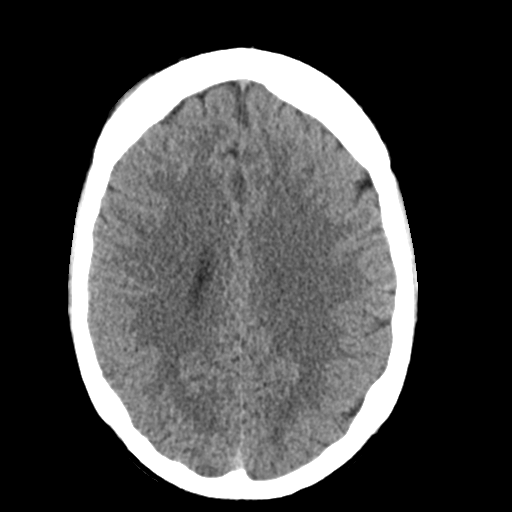
[im 20/30  brain]
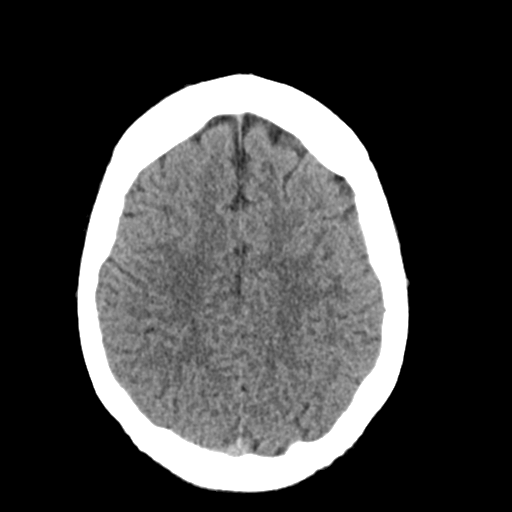
[im 23/30  brain]
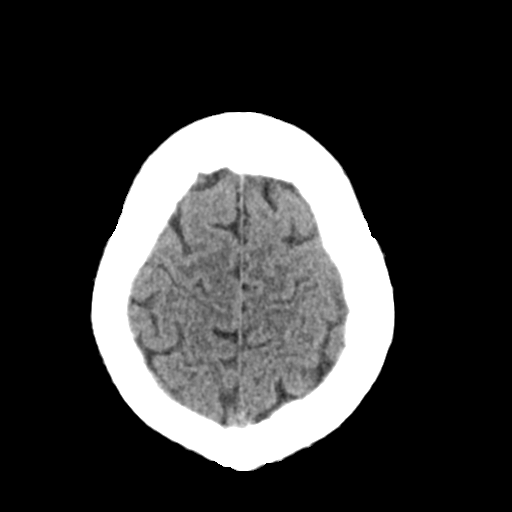
[im 25/30  brain]
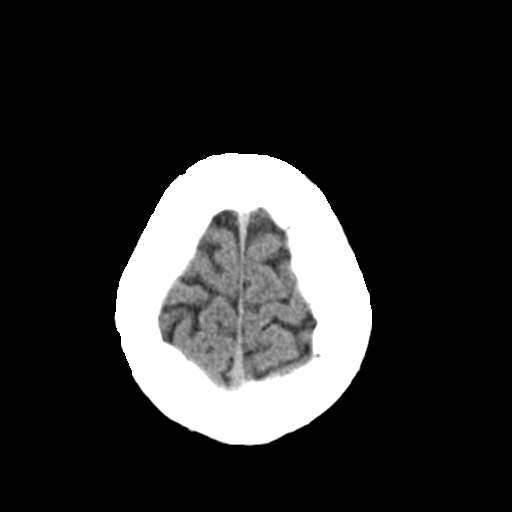
[im 25/30  bone]
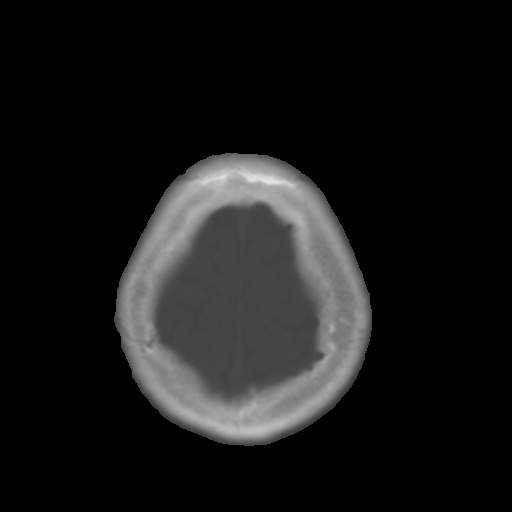
[im 28/30  brain]
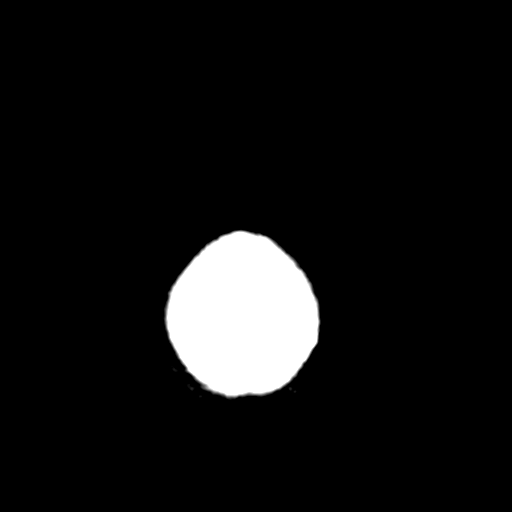

[Series 4: coronal soft tissue · coronal · 0.31mm/px · 3 of 69 slices shown]
[im 23/69  brain]
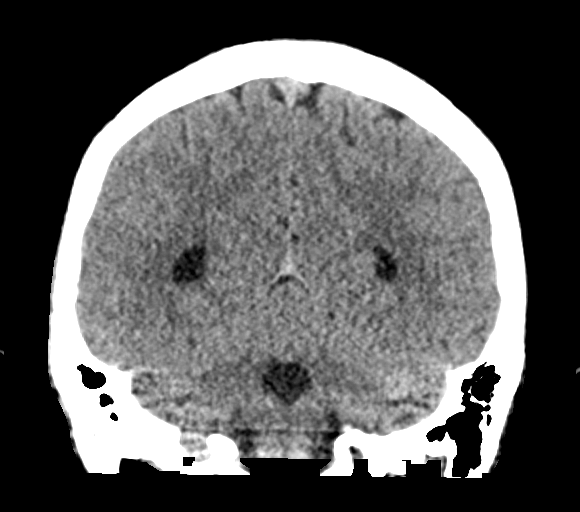
[im 31/69  brain]
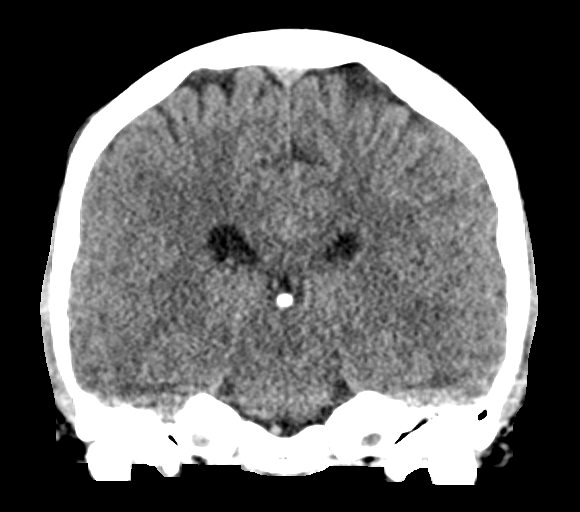
[im 38/69  brain]
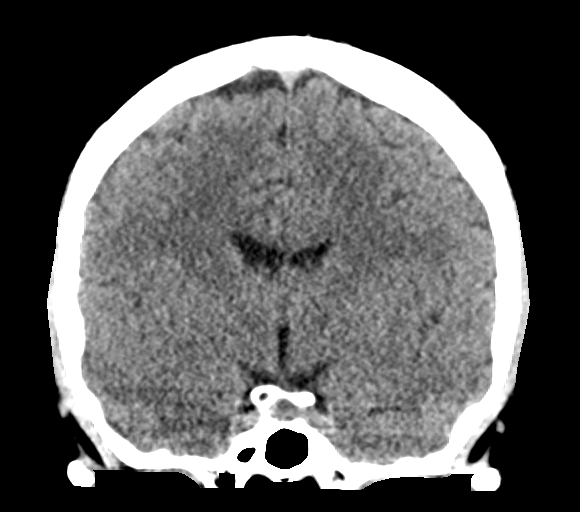

[Series 5: sagittal soft tissue · sagittal · 0.31mm/px · 3 of 60 slices shown]
[im 20/60  brain]
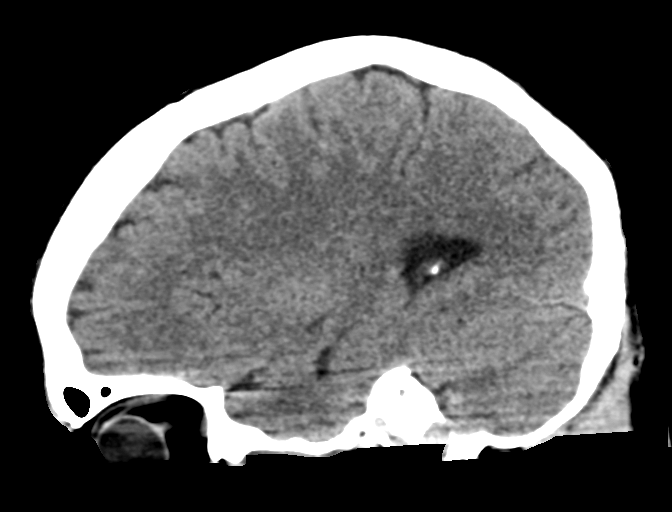
[im 30/60  brain]
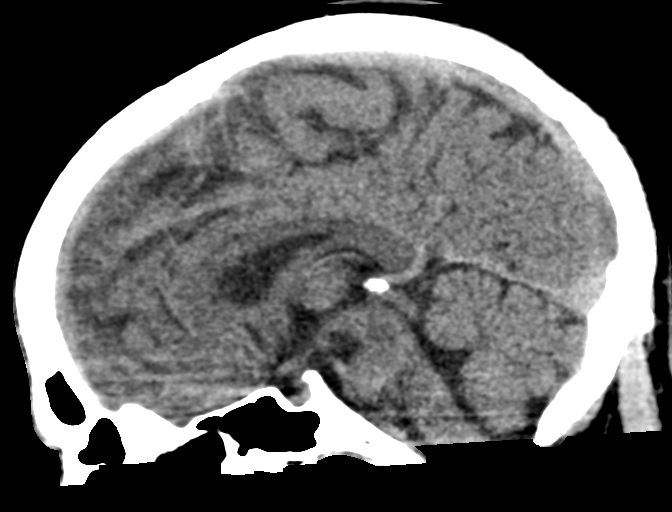
[im 40/60  brain]
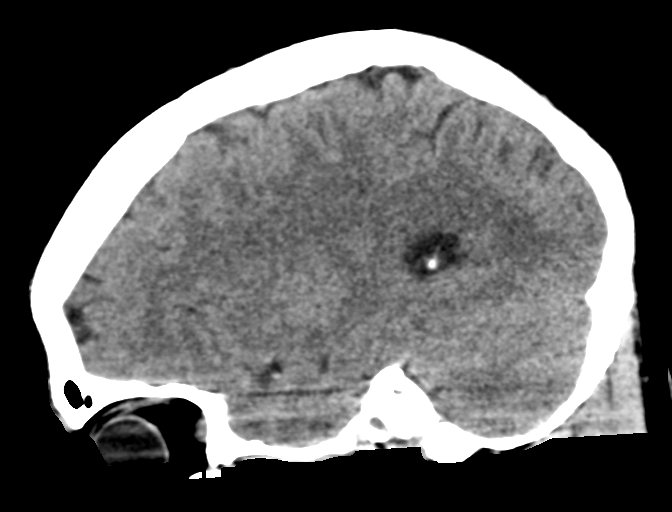

[16 of 47 positions shown; findings below may reference images not displayed]

FINDINGS: Brain: No evidence of acute infarction, hemorrhage, hydrocephalus,
extra-axial collection or mass lesion/mass effect.

Vascular: No hyperdense vessel or unexpected calcification.

Skull: Normal. Negative for fracture or focal lesion.

Sinuses/Orbits: No acute finding.

Other: Mild soft tissue swelling over the right lateral forehead.
IMPRESSION: No acute intracranial abnormalities.

## 2021-05-20 ENCOUNTER — Telehealth: Payer: Self-pay

## 2021-05-20 ENCOUNTER — Other Ambulatory Visit: Payer: Self-pay

## 2021-05-20 ENCOUNTER — Encounter: Payer: Self-pay | Admitting: Physician Assistant

## 2021-05-20 ENCOUNTER — Ambulatory Visit (INDEPENDENT_AMBULATORY_CARE_PROVIDER_SITE_OTHER): Payer: Managed Care, Other (non HMO) | Admitting: Physician Assistant

## 2021-05-20 DIAGNOSIS — R5383 Other fatigue: Secondary | ICD-10-CM

## 2021-05-20 DIAGNOSIS — G4700 Insomnia, unspecified: Secondary | ICD-10-CM | POA: Diagnosis not present

## 2021-05-20 DIAGNOSIS — F411 Generalized anxiety disorder: Secondary | ICD-10-CM

## 2021-05-20 DIAGNOSIS — F900 Attention-deficit hyperactivity disorder, predominantly inattentive type: Secondary | ICD-10-CM | POA: Diagnosis not present

## 2021-05-20 DIAGNOSIS — R300 Dysuria: Secondary | ICD-10-CM

## 2021-05-20 DIAGNOSIS — F1911 Other psychoactive substance abuse, in remission: Secondary | ICD-10-CM | POA: Diagnosis not present

## 2021-05-20 DIAGNOSIS — Z0001 Encounter for general adult medical examination with abnormal findings: Secondary | ICD-10-CM

## 2021-05-20 MED ORDER — HYDROXYZINE HCL 50 MG PO TABS: ORAL_TABLET | ORAL | 1 refills | Status: DC

## 2021-05-20 NOTE — Patient Instructions (Signed)
Testicular Self-Exam ?A self-exam of your testicles (testicular self-exam) is looking at and feeling your testicles for unusual lumps or swelling. Swelling, lumps, or pain can be caused by: ?Injuries. ?Irritation and swelling (inflammation). ?Infection. ?Extra fluids around the testicle (hydrocele). ?Twisted testicles (testicular torsion). ?Cancer of the testicle (testicular cancer). You may be at risk for this if you have: ?A testicle that has not descended (cryptorchidism). ?A history of cancer of the testicle. ?A family history of cancer of the testicle. ?General tips ?It is easiest to do a self-exam after a warm bath or shower. Testicles are harder to examine when you are cold. ?A normal testicle is egg-shaped and feels firm. It is smooth, and it is not tender. ?It is normal to feel a firm cord that feels like spaghetti at the back of your testicles. This is called the spermatic cord. ?How to do a self-exam of testicles ? ?Stand and hold your penis away from your body. ?Look at each testicle to check for changes in how it looks. Look for swelling or changes in size or shape. ?Roll each testicle between your thumb and finger. Feel the whole testicle. Feel for: ?Lumps. ?Swelling. ?Discomfort. ?Check for swelling or tender bumps in the groin area. Your groin is where your lower belly (abdomen) meets your upper thighs. ?Contact a doctor if: ?You find a bump or lump. This may be a small, hard bump that is the size of a pea. ?You find swelling. ?You find pain. ?You find soreness. ?You see or feel any other changes. ?Summary ?A self-exam of your testicles is looking at and feeling your testicles for lumps or swelling. ?You should check each of your testicles for lumps, swelling, or discomfort. ?You should check for swelling or tender bumps in the groin area. Your groin is where your lower belly (abdomen) meets your upper thighs. ?This information is not intended to replace advice given to you by your health care  provider. Make sure you discuss any questions you have with your health care provider. ?Document Revised: 04/29/2019 Document Reviewed: 04/29/2019 ?Elsevier Patient Education ? 2022 Elsevier Inc. ? ?

## 2021-05-20 NOTE — Progress Notes (Signed)
[facility] [address] [place], [place] [address]  Internal MEDICINE  Office Visit Note  Patient Name: Dylan Evans  [id number]  [id number]  Date of Service: 05/23/2021  Chief Complaint  Patient presents with   Annual Exam     HPI Pt is here for routine health maintenance examination -Reports ADHD hx and states it has become worse recently. Even with tasks he likes to do he cant focus and feels restless. Can get irritated or anxious. Had previously been told he would need to be seen by psych for eval and treatment of this given his hx and potential for treatment with meds that are high risk of abuse -Used to take lexapro but didn't like how he felt and dealt with some of his problems. Feels better now and doesn't want to take medications.  -Takes hydroxyzine at night to help him sleep, but did run out of this.  -Not much exercise lately, goal for new year. Working on diet. Cutting down sodas -Drinks 3-4beers twice per week. Former smoker. Used to use cocaine, but got arrested and got help and will be 1 year clean next month. 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 More noticeable with non-work tasks, but also now starting to affect work environment too. -Due for routine fasting labs, given info for self testicular exams as well  Current Medication: Outpatient Encounter Medications as of 05/20/2021  Medication Sig   [DISCONTINUED] hydrOXYzine (ATARAX/VISTARIL) 50 MG tablet TAKE 1 TABLET(50 MG) BY MOUTH THREE TIMES DAILY AS NEEDED   escitalopram (LEXAPRO) 20 MG tablet Take 1 tablet (20 mg total) by mouth daily. (Patient not taking: Reported on 05/20/2021)   hydrOXYzine (ATARAX) 50 MG tablet TAKE 1 TABLET(50 MG) BY MOUTH at night as needed   [DISCONTINUED] azithromycin (ZITHROMAX) 250 MG tablet Take one tab a day for 10 days for uri (Patient not taking: Reported on 05/20/2021)   No facility-administered encounter  medications on file as of 05/20/2021.    Surgical History: Past Surgical History:  Procedure Laterality Date   tubes in ears      Medical History: Past Medical History:  Diagnosis Date   Anxiety     Family History: Family History  Problem Relation Age of Onset   Stomach cancer Maternal Grandmother    Interstitial cystitis Maternal Grandfather    Stomach cancer Paternal Grandmother       Review of Systems  Constitutional:  Negative for chills, fatigue and unexpected weight change.  HENT:  Negative for congestion, postnasal drip, rhinorrhea, sneezing and sore throat.   Eyes:  Negative for redness.  Respiratory:  Negative for cough, chest tightness and shortness of breath.   Cardiovascular:  Negative for chest pain and palpitations.  Gastrointestinal:  Negative for abdominal pain, constipation, diarrhea, nausea and vomiting.  Genitourinary:  Negative for dysuria and frequency.  Musculoskeletal:  Negative for arthralgias, back pain, joint swelling and neck pain.  Skin:  Negative for rash.  Neurological: Negative.  Negative for tremors and numbness.  Hematological:  Negative for adenopathy. Does not bruise/bleed easily.  Psychiatric/Behavioral:  Positive for decreased concentration and sleep disturbance. Negative for behavioral problems (Depression) and suicidal ideas. The patient is nervous/anxious.     Vital Signs: BP 130/75    Pulse 66    Temp 98.5 F (36.9 C)    Resp 16    Ht 5\' 8"  (1.727 m)    Wt 165 lb 12.8 oz (75.2 kg)  SpO2 97%    BMI 25.21 kg/m    Physical Exam Vitals reviewed.  Constitutional:      Appearance: Normal appearance. He is normal weight.  HENT:     Head: Normocephalic and atraumatic.     Right Ear: External ear normal.     Left Ear: External ear normal.  Eyes:     Extraocular Movements: Extraocular movements intact.     Pupils: Pupils are equal, round, and reactive to light.  Cardiovascular:     Rate and Rhythm: Normal rate and regular  rhythm.     Pulses: Normal pulses.     Heart sounds: Normal heart sounds.  Pulmonary:     Effort: Pulmonary effort is normal.     Breath sounds: Normal breath sounds.  Abdominal:     General: Abdomen is flat.     Palpations: Abdomen is soft.     Tenderness: There is no abdominal tenderness.  Musculoskeletal:        General: Normal range of motion.     Cervical back: Normal range of motion.  Skin:    General: Skin is warm.  Neurological:     General: No focal deficit present.     Mental Status: He is alert and oriented to person, place, and time. Mental status is at baseline.  Psychiatric:        Attention and Perception: Attention normal.        Mood and Affect: Mood is anxious.        Speech: Speech normal.        Behavior: Behavior normal.     Comments: Fidgeting     LABS: Recent Results (from the past 2160 hour(s))  UA/M w/rflx Culture, Routine     Status: None   Collection Time: 05/20/21 11:11 AM   Specimen: Urine   Urine  Result Value Ref Range   Specific Gravity, UA 1.012 1.005 - 1.030   pH, UA 5.5 5.0 - 7.5   Color, UA Yellow Yellow   Appearance Ur Clear Clear   Leukocytes,UA Negative Negative   Protein,UA Negative Negative/Trace   Glucose, UA Negative Negative   Ketones, UA Negative Negative   RBC, UA Negative Negative   Bilirubin, UA Negative Negative   Urobilinogen, Ur 0.2 0.2 - 1.0 mg/dL   Nitrite, UA Negative Negative   Microscopic Examination Comment     Comment: Microscopic follows if indicated.   Microscopic Examination See below:     Comment: Microscopic was indicated and was performed.   Urinalysis Reflex Comment     Comment: This specimen will not reflex to a Urine Culture.  Microscopic Examination     Status: None   Collection Time: 05/20/21 11:11 AM   Urine  Result Value Ref Range   WBC, UA None seen 0 - 5 /hpf   RBC None seen 0 - 2 /hpf   Epithelial Cells (non renal) None seen 0 - 10 /hpf   Casts None seen None seen /lpf   Bacteria, UA  None seen None seen/Few        Assessment/Plan: 1. Encounter for general adult medical examination with abnormal findings CPE performed, Routine fasting labs ordered  2. Insomnia, unspecified type May continue hydroxyzine as needed - hydrOXYzine (ATARAX) 50 MG tablet; TAKE 1 TABLET(50 MG) BY MOUTH at night as needed  Dispense: 90 tablet; Refill: 1  3. ADHD, predominantly inattentive type Will refer to psych due to hx of cocaine abuse and potential high risk med use - Ambulatory  referral to Psychiatry  4. Hx of drug abuse (HCC) Hx of cocaine abuse, will refer to psych - Ambulatory referral to Psychiatry  5. GAD (generalized anxiety disorder) Will refer to psych for further eval due to ADHD symptoms along with anxiety - Ambulatory referral to Psychiatry  6. Other fatigue - CBC w/Diff/Platelet - Comprehensive metabolic panel - Lipid Panel With LDL/HDL Ratio - TSH + free T4  7. Dysuria - UA/M w/rflx Culture, Routine   General Counseling: Keymari verbalizes understanding of the findings of todays visit and agrees with plan of treatment. I have discussed any further diagnostic evaluation that may be needed or ordered today. We also reviewed his medications today. he has been encouraged to call the office with any questions or concerns that should arise related to todays visit.    Counseling:    Orders Placed This Encounter  Procedures   Microscopic Examination   UA/M w/rflx Culture, Routine   CBC w/Diff/Platelet   Comprehensive metabolic panel   Lipid Panel With LDL/HDL Ratio   TSH + free T4   Ambulatory referral to Psychiatry    Meds ordered this encounter  Medications   hydrOXYzine (ATARAX) 50 MG tablet    Sig: TAKE 1 TABLET(50 MG) BY MOUTH at night as needed    Dispense:  90 tablet    Refill:  1    This patient was seen by Lynn Ito, PA-C in collaboration with Dr. Beverely Risen as a part of collaborative care agreement.  Total time spent:35  Minutes  Time spent includes review of chart, medications, test results, and follow up plan with the patient.     Lyndon Code, MD  Internal Medicine

## 2021-05-20 NOTE — Telephone Encounter (Signed)
Referral sent via [facility] 

## 2021-05-21 LAB — UA/M W/RFLX CULTURE, ROUTINE
Bilirubin, UA: NEGATIVE
Glucose, UA: NEGATIVE
Ketones, UA: NEGATIVE
Leukocytes,UA: NEGATIVE
Nitrite, UA: NEGATIVE
Protein,UA: NEGATIVE
RBC, UA: NEGATIVE
Specific Gravity, UA: 1.012 (ref 1.005–1.030)
Urobilinogen, Ur: 0.2 mg/dL (ref 0.2–1.0)
pH, UA: 5.5 (ref 5.0–7.5)

## 2021-05-21 LAB — MICROSCOPIC EXAMINATION
Bacteria, UA: NONE SEEN
Casts: NONE SEEN /lpf
Epithelial Cells (non renal): NONE SEEN /hpf (ref 0–10)
RBC, Urine: NONE SEEN /hpf (ref 0–2)
WBC, UA: NONE SEEN /hpf (ref 0–5)

## 2022-05-06 ENCOUNTER — Encounter: Payer: Managed Care, Other (non HMO) | Admitting: Physician Assistant

## 2022-06-09 ENCOUNTER — Ambulatory Visit (INDEPENDENT_AMBULATORY_CARE_PROVIDER_SITE_OTHER): Payer: Managed Care, Other (non HMO) | Admitting: Physician Assistant

## 2022-06-09 ENCOUNTER — Encounter: Payer: Self-pay | Admitting: Physician Assistant

## 2022-06-09 VITALS — BP 129/77 | HR 94 | Temp 97.8°F | Resp 16 | Ht 68.0 in | Wt 145.0 lb

## 2022-06-09 DIAGNOSIS — R300 Dysuria: Secondary | ICD-10-CM | POA: Diagnosis not present

## 2022-06-09 DIAGNOSIS — R5383 Other fatigue: Secondary | ICD-10-CM | POA: Diagnosis not present

## 2022-06-09 DIAGNOSIS — Z0001 Encounter for general adult medical examination with abnormal findings: Secondary | ICD-10-CM | POA: Diagnosis not present

## 2022-06-09 DIAGNOSIS — G4700 Insomnia, unspecified: Secondary | ICD-10-CM | POA: Diagnosis not present

## 2022-06-09 DIAGNOSIS — F900 Attention-deficit hyperactivity disorder, predominantly inattentive type: Secondary | ICD-10-CM | POA: Diagnosis not present

## 2022-06-09 NOTE — Patient Instructions (Signed)
Testicular Self-Exam A self-exam of your testicles (testicular self-exam) is looking at and feeling your testicles for unusual lumps or swelling. Swelling, lumps, or pain can be caused by: Injuries. Irritation and swelling (inflammation). Infection. Extra fluids around the testicle (hydrocele). Twisted testicles (testicular torsion). Cancer of the testicle (testicular cancer). You may be at risk for this if you have: A testicle that has not descended (cryptorchidism). A history of cancer of the testicle. A family history of cancer of the testicle. General tips It is easiest to do a self-exam after a warm bath or shower. Testicles are harder to examine when you are cold. A normal testicle is egg-shaped and feels firm. It is smooth, and it is not tender. It is normal to feel a firm cord that feels like spaghetti at the back of your testicles. This is called the spermatic cord. How to do a self-exam of testicles  Stand and hold your penis away from your body. Look at each testicle to check for changes in how it looks. Look for swelling or changes in size or shape. Roll each testicle between your thumb and finger. Feel the whole testicle. Feel for: Lumps. Swelling. Discomfort. Check for swelling or tender bumps in the groin area. Your groin is where your lower belly (abdomen) meets your upper thighs. Contact a doctor if: You find a bump or lump. This may be a small, hard bump that is the size of a pea. You find swelling. You find pain. You find soreness. You see or feel any other changes. Summary A self-exam of your testicles is looking at and feeling your testicles for lumps or swelling. You should check each of your testicles for lumps, swelling, or discomfort. You should check for swelling or tender bumps in the groin area. Your groin is where your lower belly (abdomen) meets your upper thighs. This information is not intended to replace advice given to you by your health care  provider. Make sure you discuss any questions you have with your health care provider. Document Revised: 04/29/2019 Document Reviewed: 04/29/2019 Elsevier Patient Education  2023 Elsevier Inc.  

## 2022-06-09 NOTE — Progress Notes (Signed)
[facility] [place], [place] [address]  Internal MEDICINE  Office Visit Note  Patient Name: Dylan Evans  [id number]  [id number]  Date of Service: 06/10/2022  Chief Complaint  Patient presents with   Annual Exam   Quality Metric Gaps    TDAP     HPI Pt is here for routine health maintenance examination -He is taking adderall now and is helping him focus now. [facility] and also do therapy as well. He is currently taking adderall XR 15mg  in AM, and then does IR adderall 5mg  around noon and found this combo works best for him. -Sleep has been better and hasn't been taking the hydroxyzine anymore -He does drink alcohol occasionally, but no illicit substance use including cocaine in almost 2 years now and doing well now. -Eating less fast food -due for routine fasting labs  Current Medication: Outpatient Encounter Medications as of 06/09/2022  Medication Sig   amphetamine-dextroamphetamine (ADDERALL XR) 15 MG 24 hr capsule Take 15 mg by mouth every morning.   amphetamine-dextroamphetamine (ADDERALL) 5 MG tablet Take 5 mg by mouth daily.   Tdap (ADACEL) 10-05-13.5 LF-MCG/0.5 injection Inject 0.5 mLs into the muscle once.   [DISCONTINUED] escitalopram (LEXAPRO) 20 MG tablet Take 1 tablet (20 mg total) by mouth daily.   [DISCONTINUED] hydrOXYzine (ATARAX) 50 MG tablet TAKE 1 TABLET(50 MG) BY MOUTH at night as needed   No facility-administered encounter medications on file as of 06/09/2022.    Surgical History: Past Surgical History:  Procedure Laterality Date   tubes in ears      Medical History: Past Medical History:  Diagnosis Date   Anxiety     Family History: Family History  Problem Relation Age of Onset   Stomach cancer Maternal Grandmother    Interstitial cystitis Maternal Grandfather    Stomach cancer Paternal Grandmother       Review of Systems  Constitutional:  Negative for  chills, fatigue and unexpected weight change.  HENT:  Negative for congestion, postnasal drip, rhinorrhea, sneezing and sore throat.   Eyes:  Negative for redness.  Respiratory:  Negative for cough, chest tightness and shortness of breath.   Cardiovascular:  Negative for chest pain and palpitations.  Gastrointestinal:  Negative for abdominal pain, constipation, diarrhea, nausea and vomiting.  Genitourinary:  Negative for dysuria and frequency.  Musculoskeletal:  Negative for arthralgias, back pain, joint swelling and neck pain.  Skin:  Negative for rash.  Neurological: Negative.  Negative for tremors and numbness.  Hematological:  Negative for adenopathy. Does not bruise/bleed easily.  Psychiatric/Behavioral:  Negative for behavioral problems (Depression), sleep disturbance and suicidal ideas. The patient is not nervous/anxious.      Vital Signs: BP 129/77   Pulse 94   Temp 97.8 F (36.6 C)   Resp 16   Ht 5\' 8"  (1.727 m)   Wt 145 lb (65.8 kg)   SpO2 98%   BMI 22.05 kg/m    Physical Exam Vitals reviewed.  Constitutional:      Appearance: Normal appearance. He is normal weight.  HENT:     Head: Normocephalic and atraumatic.     Right Ear: External ear normal.     Left Ear: External ear normal.  Eyes:     Extraocular Movements: Extraocular movements intact.     Pupils: Pupils are equal, round, and reactive to light.  Cardiovascular:     Rate and Rhythm: Normal rate and regular  rhythm.     Pulses: Normal pulses.     Heart sounds: Normal heart sounds.  Pulmonary:     Effort: Pulmonary effort is normal.     Breath sounds: Normal breath sounds.  Abdominal:     General: Abdomen is flat.     Palpations: Abdomen is soft.     Tenderness: There is no abdominal tenderness.  Musculoskeletal:        General: Normal range of motion.     Cervical back: Normal range of motion.  Skin:    General: Skin is warm.  Neurological:     General: No focal deficit present.     Mental  Status: He is alert and oriented to person, place, and time. Mental status is at baseline.  Psychiatric:        Attention and Perception: Attention normal.        Mood and Affect: Mood normal.        Speech: Speech normal.        Behavior: Behavior normal.     Comments: Fidgeting      LABS: Recent Results (from the past 2160 hour(s))  UA/M w/rflx Culture, Routine     Status: Abnormal   Collection Time: 06/09/22  3:48 PM   Specimen: Urine   Urine  Result Value Ref Range   Specific Gravity, UA      <=1.005 (A) 1.005 - 1.030   pH, UA 6.0 5.0 - 7.5   Color, UA Yellow Yellow   Appearance Ur Clear Clear   Leukocytes,UA Negative Negative   Protein,UA Negative Negative/Trace   Glucose, UA Negative Negative   Ketones, UA Negative Negative   RBC, UA Negative Negative   Bilirubin, UA Negative Negative   Urobilinogen, Ur 0.2 0.2 - 1.0 mg/dL   Nitrite, UA Negative Negative   Microscopic Examination Comment     Comment: Microscopic follows if indicated.   Microscopic Examination See below:     Comment: Microscopic was indicated and was performed.   Urinalysis Reflex Comment     Comment: This specimen will not reflex to a Urine Culture.  Microscopic Examination     Status: None   Collection Time: 06/09/22  3:48 PM   Urine  Result Value Ref Range   WBC, UA None seen 0 - 5 /hpf   RBC, Urine None seen 0 - 2 /hpf   Epithelial Cells (non renal) None seen 0 - 10 /hpf   Casts None seen None seen /lpf   Bacteria, UA None seen None seen/Few        Assessment/Plan: 1. Encounter for general adult medical examination with abnormal findings Cpe performed, routine labs ordered - CBC w/Diff/Platelet - Comprehensive metabolic panel - TSH + free T4 - Lipid Panel With LDL/HDL Ratio  2. ADHD, predominantly inattentive type Followed by psych  3. Insomnia, unspecified type Improved, no longer taking hydroxyzine  4. Other fatigue - CBC w/Diff/Platelet - Comprehensive metabolic panel -  TSH + free T4 - Lipid Panel With LDL/HDL Ratio  5. Dysuria - UA/M w/rflx Culture, Routine - Microscopic Examination   General Counseling: Dylan Evans verbalizes understanding of the findings of todays visit and agrees with plan of treatment. I have discussed any further diagnostic evaluation that may be needed or ordered today. We also reviewed his medications today. he has been encouraged to call the office with any questions or concerns that should arise related to todays visit.    Counseling:    Orders Placed This Encounter  Procedures  Microscopic Examination   UA/M w/rflx Culture, Routine   CBC w/Diff/Platelet   Comprehensive metabolic panel   TSH + free T4   Lipid Panel With LDL/HDL Ratio    No orders of the defined types were placed in this encounter.   This patient was seen by Lynn Ito, PA-C in collaboration with Dr. Beverely Risen as a part of collaborative care agreement.  Total time spent:35 Minutes  Time spent includes review of chart, medications, test results, and follow up plan with the patient.     Lyndon Code, MD  Internal Medicine

## 2022-06-10 LAB — MICROSCOPIC EXAMINATION
Bacteria, UA: NONE SEEN
Casts: NONE SEEN /lpf
Epithelial Cells (non renal): NONE SEEN /hpf (ref 0–10)
RBC, Urine: NONE SEEN /hpf (ref 0–2)
WBC, UA: NONE SEEN /hpf (ref 0–5)

## 2022-06-10 LAB — UA/M W/RFLX CULTURE, ROUTINE
Bilirubin, UA: NEGATIVE
Glucose, UA: NEGATIVE
Ketones, UA: NEGATIVE
Leukocytes,UA: NEGATIVE
Nitrite, UA: NEGATIVE
Protein,UA: NEGATIVE
RBC, UA: NEGATIVE
Specific Gravity, UA: <=1.005 — AB (ref 1.005–1.030)
Urobilinogen, Ur: 0.2 mg/dL (ref 0.2–1.0)
pH, UA: 6 (ref 5.0–7.5)

## 2022-06-11 LAB — CBC WITH DIFFERENTIAL/PLATELET
Basophils Absolute: 0 10*3/uL (ref 0.0–0.2)
Basos: 0 %
EOS (ABSOLUTE): 0.1 10*3/uL (ref 0.0–0.4)
Eos: 2 %
Hematocrit: 42.7 % (ref 37.5–51.0)
Hemoglobin: 14.9 g/dL (ref 13.0–17.7)
Immature Grans (Abs): 0 10*3/uL (ref 0.0–0.1)
Immature Granulocytes: 0 %
Lymphocytes Absolute: 1.5 10*3/uL (ref 0.7–3.1)
Lymphs: 33 %
MCH: 31.1 pg (ref 26.6–33.0)
MCHC: 34.9 g/dL (ref 31.5–35.7)
MCV: 89 fL (ref 79–97)
Monocytes Absolute: 0.3 10*3/uL (ref 0.1–0.9)
Monocytes: 7 %
Neutrophils Absolute: 2.6 10*3/uL (ref 1.4–7.0)
Neutrophils: 58 %
Platelets: 191 10*3/uL (ref 150–450)
RBC: 4.79 x10E6/uL (ref 4.14–5.80)
RDW: 11.9 % (ref 11.6–15.4)
WBC: 4.5 10*3/uL (ref 3.4–10.8)

## 2022-06-11 LAB — COMPREHENSIVE METABOLIC PANEL
ALT: 21 IU/L (ref 0–44)
AST: 26 IU/L (ref 0–40)
Albumin/Globulin Ratio: 2.3 — ABNORMAL HIGH (ref 1.2–2.2)
Albumin: 4.6 g/dL (ref 4.3–5.2)
Alkaline Phosphatase: 55 IU/L (ref 44–121)
BUN/Creatinine Ratio: 11 (ref 9–20)
BUN: 12 mg/dL (ref 6–20)
Bilirubin Total: 0.6 mg/dL (ref 0.0–1.2)
CO2: 25 mmol/L (ref 20–29)
Calcium: 9.8 mg/dL (ref 8.7–10.2)
Chloride: 102 mmol/L (ref 96–106)
Creatinine, Ser: 1.14 mg/dL (ref 0.76–1.27)
Globulin, Total: 2 g/dL (ref 1.5–4.5)
Glucose: 94 mg/dL (ref 70–99)
Potassium: 4.8 mmol/L (ref 3.5–5.2)
Sodium: 139 mmol/L (ref 134–144)
Total Protein: 6.6 g/dL (ref 6.0–8.5)
eGFR: 92 mL/min/{1.73_m2} (ref >59)

## 2022-06-11 LAB — TSH+FREE T4
Free T4: 1.3 ng/dL (ref 0.82–1.77)
TSH: 0.674 u[IU]/mL (ref 0.450–4.500)

## 2022-06-11 LAB — LIPID PANEL WITH LDL/HDL RATIO
Cholesterol, Total: 157 mg/dL (ref 100–199)
HDL: 58 mg/dL (ref >39)
LDL Chol Calc (NIH): 84 mg/dL (ref 0–99)
LDL/HDL Ratio: 1.4 ratio (ref 0.0–3.6)
Triglycerides: 79 mg/dL (ref 0–149)
VLDL Cholesterol Cal: 15 mg/dL (ref 5–40)

## 2022-06-21 ENCOUNTER — Telehealth: Payer: Self-pay

## 2022-06-21 NOTE — Telephone Encounter (Signed)
LMOM for patient that labs are normal.

## 2022-06-21 NOTE — Telephone Encounter (Signed)
-----  Message from Mylinda Latina, PA-C sent at 06/21/2022  1:42 PM EST ----- Please let him know his labs looked good

## 2023-05-25 ENCOUNTER — Other Ambulatory Visit: Payer: Self-pay

## 2023-06-15 ENCOUNTER — Encounter: Payer: Managed Care, Other (non HMO) | Admitting: Nurse Practitioner

## 2023-07-11 ENCOUNTER — Ambulatory Visit: Payer: Managed Care, Other (non HMO) | Admitting: Nurse Practitioner

## 2023-07-11 ENCOUNTER — Encounter: Payer: Self-pay | Admitting: Nurse Practitioner

## 2023-07-11 VITALS — BP 140/88 | HR 86 | Temp 98.8°F | Resp 16 | Ht 68.0 in | Wt 142.6 lb

## 2023-07-11 DIAGNOSIS — F900 Attention-deficit hyperactivity disorder, predominantly inattentive type: Secondary | ICD-10-CM

## 2023-07-11 DIAGNOSIS — Z0001 Encounter for general adult medical examination with abnormal findings: Secondary | ICD-10-CM | POA: Diagnosis not present

## 2023-07-11 DIAGNOSIS — G4700 Insomnia, unspecified: Secondary | ICD-10-CM

## 2023-07-11 NOTE — Progress Notes (Signed)
 [facility] [address] [place], [place] [address]  Internal MEDICINE  Office Visit Note  Patient Name: Dylan Evans  [id number]  [id number]  Date of Service: 07/11/2023  Chief Complaint  Patient presents with   Annual Exam    HPI Dylan Evans presents for an annual well visit and physical exam.  Well-appearing 28 y.o. male with ADHD and no other medical problems.  Labs: deferred for now, had labs done last year New or worsening pain: none  Had ADHD and has [facility]    Current Medication: Outpatient Encounter Medications as of 07/11/2023  Medication Sig   amphetamine-dextroamphetamine (ADDERALL XR) 15 MG 24 hr capsule Take 15 mg by mouth every morning.   amphetamine-dextroamphetamine (ADDERALL) 5 MG tablet Take 5 mg by mouth daily.   Tdap (ADACEL) 10-05-13.5 LF-MCG/0.5 injection Inject 0.5 mLs into the muscle once.   No facility-administered encounter medications on file as of 07/11/2023.    Surgical History: Past Surgical History:  Procedure Laterality Date   tubes in ears      Medical History: Past Medical History:  Diagnosis Date   Anxiety     Family History: Family History  Problem Relation Age of Onset   Stomach cancer Maternal Grandmother    Interstitial cystitis Maternal Grandfather    Stomach cancer Paternal Grandmother     Social History   Socioeconomic History   Marital status: Single    Spouse name: Not on file   Number of children: Not on file   Years of education: Not on file   Highest education level: Not on file  Occupational History   Not on file  Tobacco Use   Smoking status: Former    Current packs/day: 0.00    Types: Cigarettes    Quit date: 09/30/2020    Years since quitting: 2.7   Smokeless tobacco: Never   Tobacco comments:    1 or 2  a week  Vaping Use   Vaping status: Every Day  Substance and Sexual Activity   Alcohol use: Yes    Alcohol/week: 5.0 - 6.0 standard  drinks of alcohol    Types: 5 - 6 Cans of beer per week    Comment: once or twice a month   Drug use: Not Currently    Types: Cocaine   Sexual activity: Not on file  Other Topics Concern   Not on file  Social History Narrative   ** Merged History Encounter **       Social Drivers of Corporate Investment [employer]er Strain: Not on file  Food Insecurity: Not on file  Transportation Needs: Not on file  Physical Activity: Not on file  Stress: Not on file  Social Connections: Not on file  Intimate Partner Violence: Not on file      Review of Systems  Constitutional:  Negative for activity change, appetite change, chills, fatigue, fever and unexpected weight change.  HENT: Negative.  Negative for congestion, ear pain, postnasal drip, rhinorrhea, sneezing, sore throat and trouble swallowing.   Eyes: Negative.  Negative for redness.  Respiratory: Negative.  Negative for cough, chest tightness, shortness of breath and wheezing.   Cardiovascular: Negative.  Negative for chest pain and palpitations.  Gastrointestinal: Negative.  Negative for abdominal pain, blood in stool, constipation, diarrhea, nausea and vomiting.  Endocrine: Negative.   Genitourinary: Negative.  Negative for difficulty urinating, dysuria, frequency, hematuria and urgency.  Musculoskeletal: Negative.  Negative for arthralgias, back  pain, joint swelling, myalgias and neck pain.  Skin: Negative.  Negative for rash and wound.  Allergic/Immunologic: Negative.  Negative for immunocompromised state.  Neurological: Negative.  Negative for dizziness, tremors, seizures, numbness and headaches.  Hematological: Negative.  Negative for adenopathy. Does not bruise/bleed easily.  Psychiatric/Behavioral:  Positive for decreased concentration. Negative for behavioral problems, self-injury, sleep disturbance and suicidal ideas. The patient is not nervous/anxious.     Vital Signs: BP (!) 140/88   Pulse 86   Temp 98.8 F (37.1 C)    Resp 16   Ht 5' 8 (1.727 m)   Wt 142 lb 9.6 oz (64.7 kg)   SpO2 97%   BMI 21.68 kg/m    Physical Exam Vitals reviewed.  Constitutional:      Appearance: Normal appearance. He is normal weight.  HENT:     Head: Normocephalic and atraumatic.     Right Ear: External ear normal.     Left Ear: External ear normal.  Eyes:     Extraocular Movements: Extraocular movements intact.     Pupils: Pupils are equal, round, and reactive to light.  Cardiovascular:     Rate and Rhythm: Normal rate and regular rhythm.     Pulses: Normal pulses.     Heart sounds: Normal heart sounds.  Pulmonary:     Effort: Pulmonary effort is normal.     Breath sounds: Normal breath sounds.  Abdominal:     General: Abdomen is flat.     Palpations: Abdomen is soft.     Tenderness: There is no abdominal tenderness.  Musculoskeletal:        General: Normal range of motion.     Cervical back: Normal range of motion.  Skin:    General: Skin is warm.  Neurological:     General: No focal deficit present.     Mental Status: He is alert and oriented to person, place, and time. Mental status is at baseline.  Psychiatric:        Attention and Perception: Attention normal.        Mood and Affect: Mood normal.        Speech: Speech normal.        Behavior: Behavior normal.     Comments: Fidgeting        Assessment/Plan: 1. Encounter for routine adult health examination with abnormal findings (Primary) Age-appropriate preventive screenings and vaccinations discussed, annual physical exam completed. Routine labs for health maintenance deferred for now. PHM updated.    2. Insomnia, unspecified type No issues at this time.  3. ADHD, predominantly inattentive type Sees a psychiatrist for this and is on adderall.      General Counseling: Dylan Evans verbalizes understanding of the findings of todays visit and agrees with plan of treatment. I have discussed any further diagnostic evaluation that may be needed or  ordered today. We also reviewed his medications today. he has been encouraged to call the office with any questions or concerns that should arise related to todays visit.    No orders of the defined types were placed in this encounter.   No orders of the defined types were placed in this encounter.   Return in about 1 year (around 07/10/2024) for CPE, Dasiah Hooley PCP and otherwise as needed. .   Total time spent:30 Minutes Time spent includes review of chart, medications, test results, and follow up plan with the patient.   [place] Controlled Substance Database was reviewed by me.  This patient was seen by Dystany Duffy  Breslin Hemann, FNP-C in collaboration with Dr. Sigrid Bathe as a part of collaborative care agreement.  Medha Pippen R. Liana, MSN, FNP-C Internal medicine

## 2024-07-11 ENCOUNTER — Encounter: Payer: Managed Care, Other (non HMO) | Admitting: Nurse Practitioner

## 2024-07-29 ENCOUNTER — Encounter: Admitting: Nurse Practitioner
